# Patient Record
Sex: Female | Born: 1945 | Race: White | Hispanic: No | Marital: Married | Smoking: Former smoker
Health system: Southern US, Community
[De-identification: ages and names within clinical notes are randomized; demographics above are authoritative.]

## PROBLEM LIST (undated history)

## (undated) DIAGNOSIS — C50419 Malignant neoplasm of upper-outer quadrant of unspecified female breast: Secondary | ICD-10-CM

## (undated) DIAGNOSIS — IMO0001 Reserved for inherently not codable concepts without codable children: Secondary | ICD-10-CM

## (undated) DIAGNOSIS — M109 Gout, unspecified: Secondary | ICD-10-CM

## (undated) DIAGNOSIS — C50919 Malignant neoplasm of unspecified site of unspecified female breast: Secondary | ICD-10-CM

## (undated) DIAGNOSIS — C801 Malignant (primary) neoplasm, unspecified: Secondary | ICD-10-CM

## (undated) DIAGNOSIS — K769 Liver disease, unspecified: Secondary | ICD-10-CM

## (undated) DIAGNOSIS — Z853 Personal history of malignant neoplasm of breast: Secondary | ICD-10-CM

## (undated) DIAGNOSIS — Z923 Personal history of irradiation: Secondary | ICD-10-CM

## (undated) DIAGNOSIS — I10 Essential (primary) hypertension: Secondary | ICD-10-CM

## (undated) DIAGNOSIS — M1990 Unspecified osteoarthritis, unspecified site: Secondary | ICD-10-CM

## (undated) DIAGNOSIS — Z1239 Encounter for other screening for malignant neoplasm of breast: Secondary | ICD-10-CM

## (undated) DIAGNOSIS — Z1211 Encounter for screening for malignant neoplasm of colon: Secondary | ICD-10-CM

## (undated) DIAGNOSIS — N63 Unspecified lump in unspecified breast: Secondary | ICD-10-CM

## (undated) DIAGNOSIS — E119 Type 2 diabetes mellitus without complications: Secondary | ICD-10-CM

## (undated) DIAGNOSIS — E669 Obesity, unspecified: Secondary | ICD-10-CM

## (undated) DIAGNOSIS — N6310 Unspecified lump in the right breast, unspecified quadrant: Secondary | ICD-10-CM

## (undated) HISTORY — DX: Unspecified lump in unspecified breast: N63.0

## (undated) HISTORY — DX: Personal history of malignant neoplasm of breast: Z85.3

## (undated) HISTORY — DX: Malignant neoplasm of upper-outer quadrant of unspecified female breast: C50.419

## (undated) HISTORY — PX: COLONOSCOPY: SHX174

## (undated) HISTORY — DX: Essential (primary) hypertension: I10

## (undated) HISTORY — PX: TUBAL LIGATION: SHX77

## (undated) HISTORY — DX: Reserved for inherently not codable concepts without codable children: IMO0001

## (undated) HISTORY — DX: Unspecified osteoarthritis, unspecified site: M19.90

## (undated) HISTORY — DX: Obesity, unspecified: E66.9

## (undated) HISTORY — PX: BACK SURGERY: SHX140

## (undated) HISTORY — DX: Gout, unspecified: M10.9

## (undated) HISTORY — DX: Unspecified lump in the right breast, unspecified quadrant: N63.10

## (undated) HISTORY — DX: Encounter for other screening for malignant neoplasm of breast: Z12.39

## (undated) HISTORY — DX: Malignant (primary) neoplasm, unspecified: C80.1

## (undated) HISTORY — DX: Encounter for screening for malignant neoplasm of colon: Z12.11

## (undated) HISTORY — DX: Liver disease, unspecified: K76.9

## (undated) HISTORY — DX: Type 2 diabetes mellitus without complications: E11.9

---

## 2003-04-05 DIAGNOSIS — M109 Gout, unspecified: Secondary | ICD-10-CM

## 2003-04-05 HISTORY — DX: Gout, unspecified: M10.9

## 2004-11-09 ENCOUNTER — Ambulatory Visit: Payer: Self-pay | Admitting: Family Medicine

## 2004-12-16 ENCOUNTER — Ambulatory Visit: Payer: Self-pay | Admitting: Specialist

## 2005-08-09 ENCOUNTER — Ambulatory Visit: Payer: Self-pay | Admitting: Gastroenterology

## 2006-02-07 ENCOUNTER — Ambulatory Visit: Payer: Self-pay

## 2006-09-15 ENCOUNTER — Ambulatory Visit: Payer: Self-pay | Admitting: Family Medicine

## 2007-04-05 DIAGNOSIS — C50919 Malignant neoplasm of unspecified site of unspecified female breast: Secondary | ICD-10-CM

## 2007-04-05 DIAGNOSIS — Z853 Personal history of malignant neoplasm of breast: Secondary | ICD-10-CM

## 2007-04-05 DIAGNOSIS — C801 Malignant (primary) neoplasm, unspecified: Secondary | ICD-10-CM

## 2007-04-05 DIAGNOSIS — IMO0001 Reserved for inherently not codable concepts without codable children: Secondary | ICD-10-CM

## 2007-04-05 HISTORY — PX: BREAST LUMPECTOMY: SHX2

## 2007-04-05 HISTORY — DX: Personal history of malignant neoplasm of breast: Z85.3

## 2007-04-05 HISTORY — DX: Malignant neoplasm of unspecified site of unspecified female breast: C50.919

## 2007-04-05 HISTORY — DX: Reserved for inherently not codable concepts without codable children: IMO0001

## 2007-04-05 HISTORY — PX: BREAST BIOPSY: SHX20

## 2007-04-05 HISTORY — DX: Malignant (primary) neoplasm, unspecified: C80.1

## 2007-09-18 ENCOUNTER — Ambulatory Visit: Payer: Self-pay | Admitting: Internal Medicine

## 2007-10-29 ENCOUNTER — Ambulatory Visit: Payer: Self-pay | Admitting: Internal Medicine

## 2007-12-19 ENCOUNTER — Ambulatory Visit: Payer: Self-pay

## 2007-12-24 ENCOUNTER — Ambulatory Visit: Payer: Self-pay

## 2008-01-03 ENCOUNTER — Other Ambulatory Visit: Payer: Self-pay

## 2008-01-03 ENCOUNTER — Ambulatory Visit: Payer: Self-pay | Admitting: Radiation Oncology

## 2008-01-03 ENCOUNTER — Ambulatory Visit: Payer: Self-pay | Admitting: General Surgery

## 2008-01-08 ENCOUNTER — Ambulatory Visit: Payer: Self-pay | Admitting: General Surgery

## 2008-01-08 HISTORY — PX: BREAST SURGERY: SHX581

## 2008-01-29 ENCOUNTER — Ambulatory Visit: Payer: Self-pay | Admitting: Internal Medicine

## 2008-02-03 ENCOUNTER — Ambulatory Visit: Payer: Self-pay | Admitting: Internal Medicine

## 2008-02-19 ENCOUNTER — Ambulatory Visit: Payer: Self-pay | Admitting: Internal Medicine

## 2008-03-04 ENCOUNTER — Ambulatory Visit: Payer: Self-pay | Admitting: Internal Medicine

## 2008-04-04 ENCOUNTER — Ambulatory Visit: Payer: Self-pay | Admitting: Internal Medicine

## 2008-05-05 ENCOUNTER — Ambulatory Visit: Payer: Self-pay | Admitting: Internal Medicine

## 2008-06-02 ENCOUNTER — Ambulatory Visit: Payer: Self-pay | Admitting: Internal Medicine

## 2008-06-17 ENCOUNTER — Ambulatory Visit: Payer: Self-pay | Admitting: Internal Medicine

## 2008-06-20 ENCOUNTER — Ambulatory Visit: Payer: Self-pay | Admitting: Internal Medicine

## 2008-07-03 ENCOUNTER — Ambulatory Visit: Payer: Self-pay | Admitting: Internal Medicine

## 2008-07-23 ENCOUNTER — Ambulatory Visit: Payer: Self-pay

## 2008-10-02 ENCOUNTER — Ambulatory Visit: Payer: Self-pay | Admitting: Internal Medicine

## 2008-10-27 ENCOUNTER — Ambulatory Visit: Payer: Self-pay | Admitting: Internal Medicine

## 2008-11-02 ENCOUNTER — Ambulatory Visit: Payer: Self-pay | Admitting: Internal Medicine

## 2008-12-16 ENCOUNTER — Ambulatory Visit: Payer: Self-pay

## 2009-01-01 IMAGING — US ULTRASOUND RIGHT BREAST
1 series · 17 of 25 positions shown · non-contrast
Comparison: none

REASON FOR EXAM: Right pain, nipple discharge
COMMENTS:

[Series 1: ultrasound right breast · 17 of 50 slices shown]
[im 1/50]
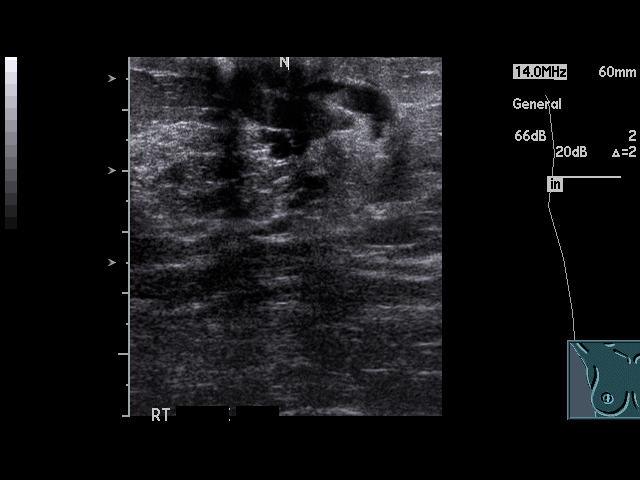
[im 5/50]
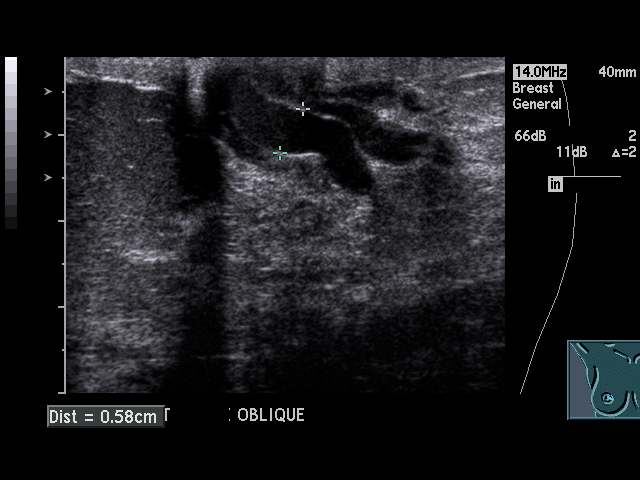
[im 7/50]
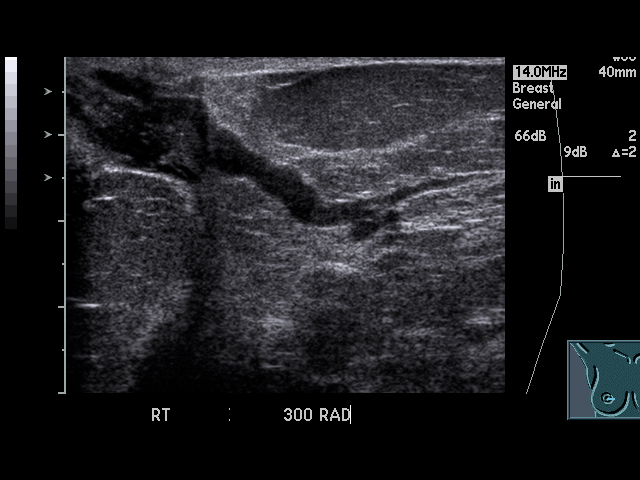
[im 11/50]
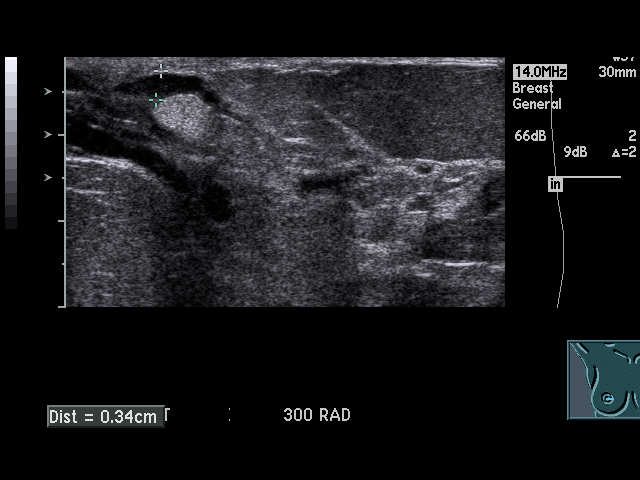
[im 13/50]
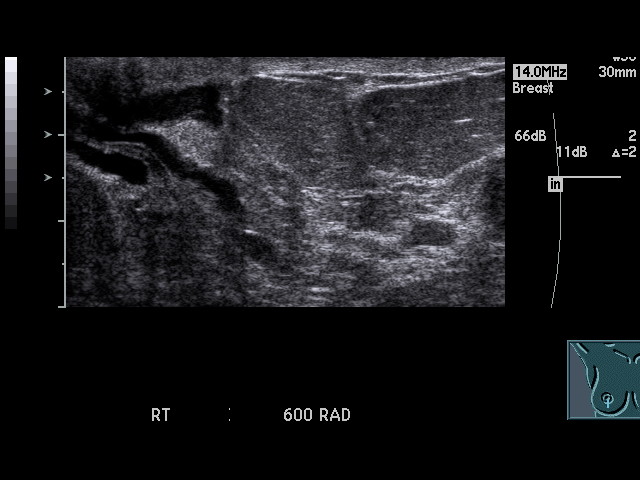
[im 17/50]
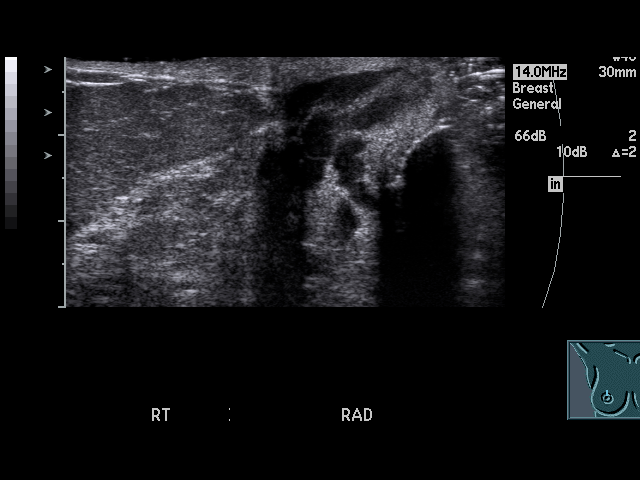
[im 19/50]
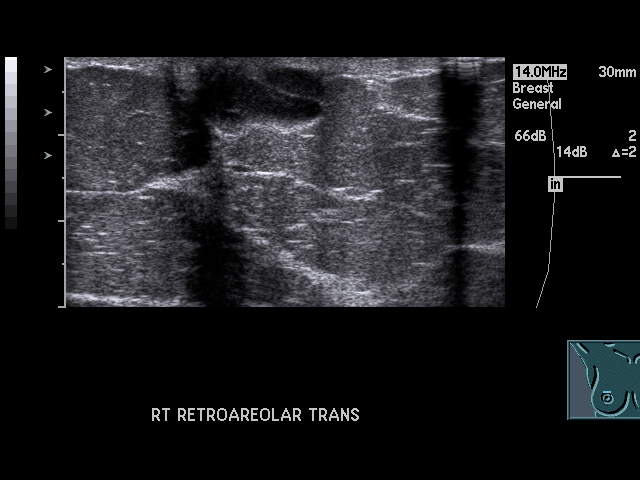
[im 23/50]
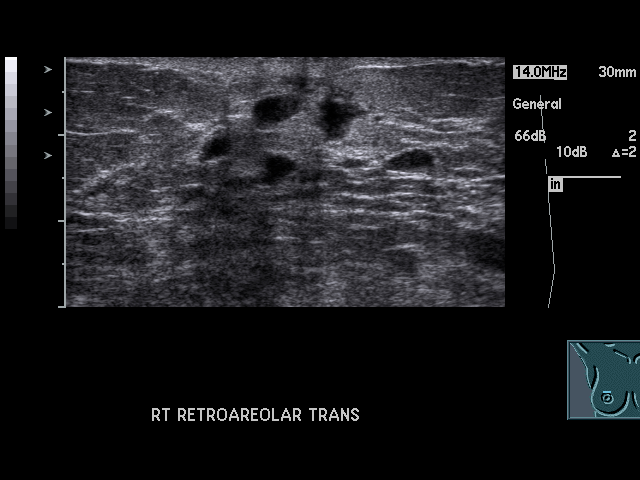
[im 25/50]
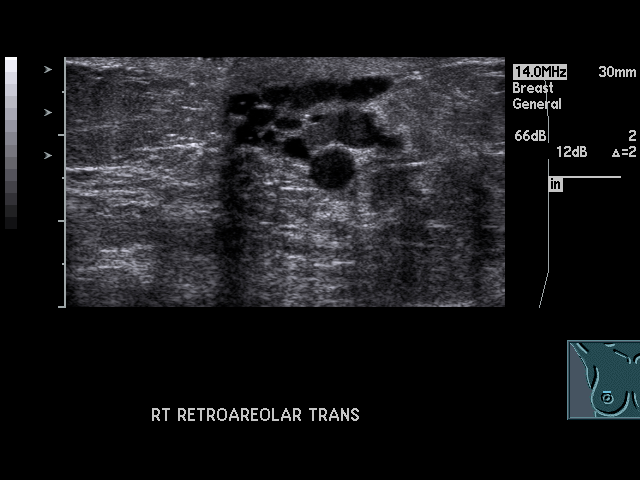
[im 27/50]
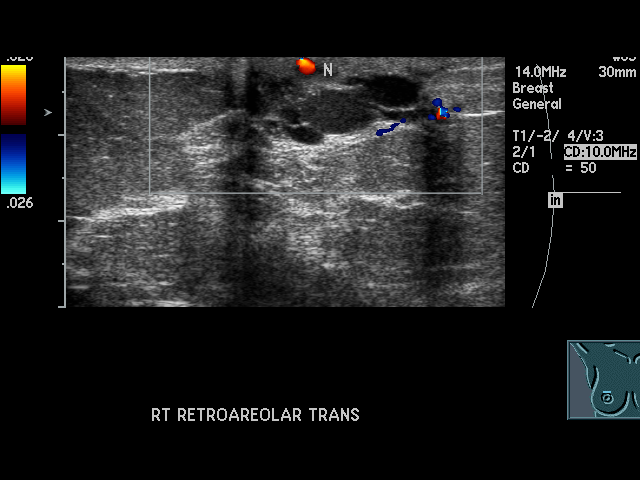
[im 31/50]
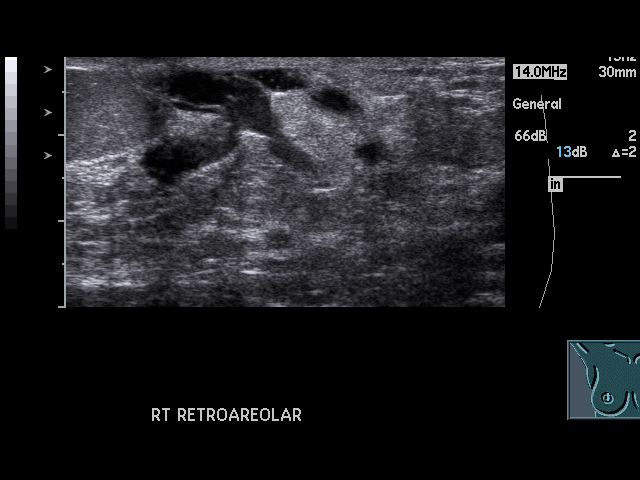
[im 33/50]
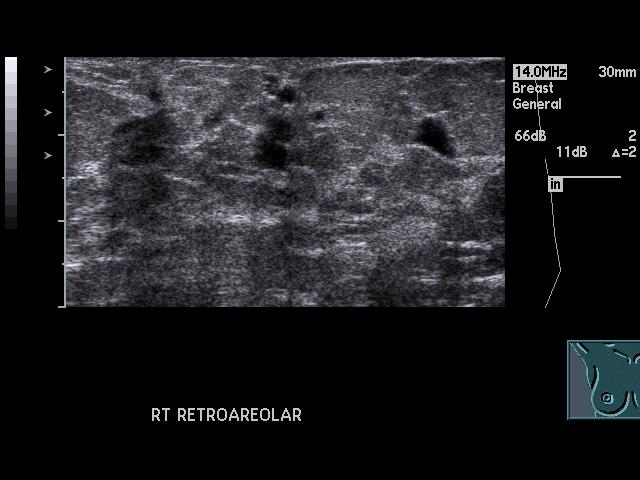
[im 37/50]
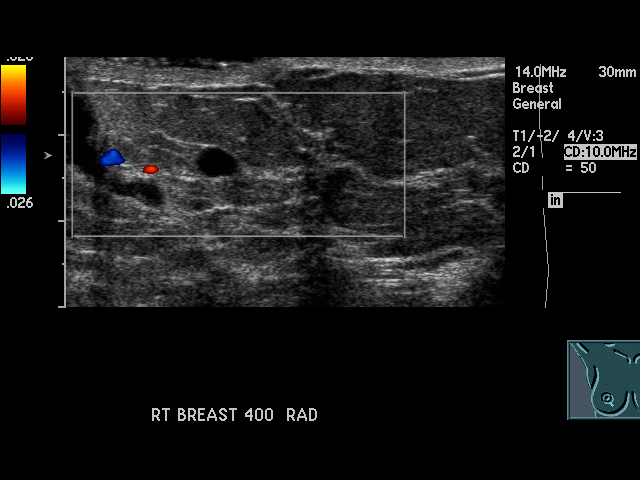
[im 39/50]
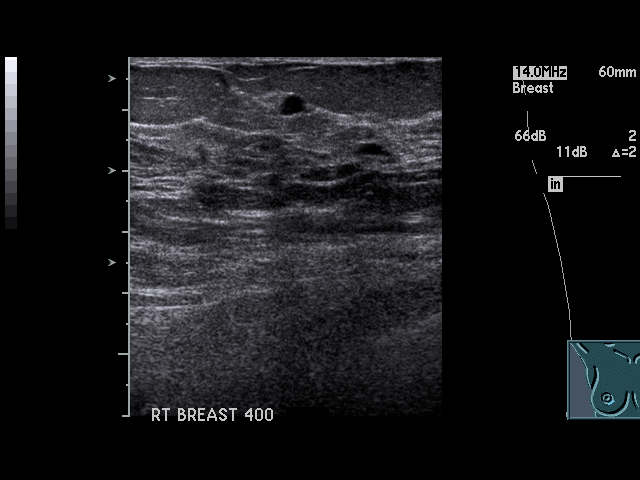
[im 43/50]
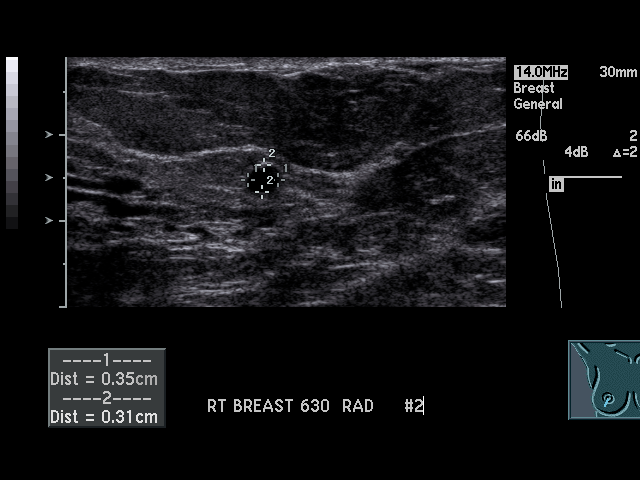
[im 45/50]
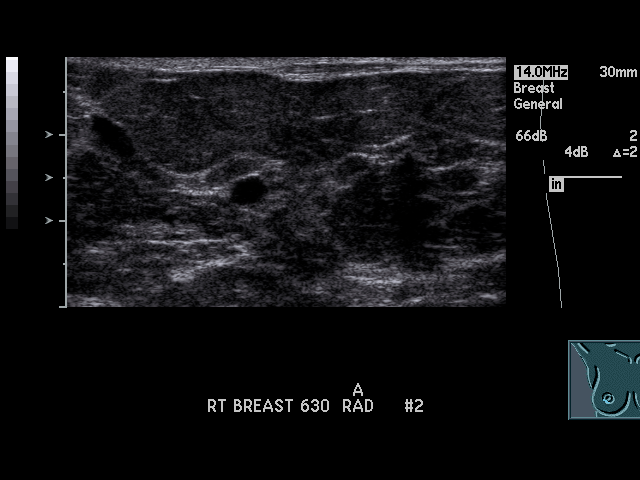
[im 50/50]
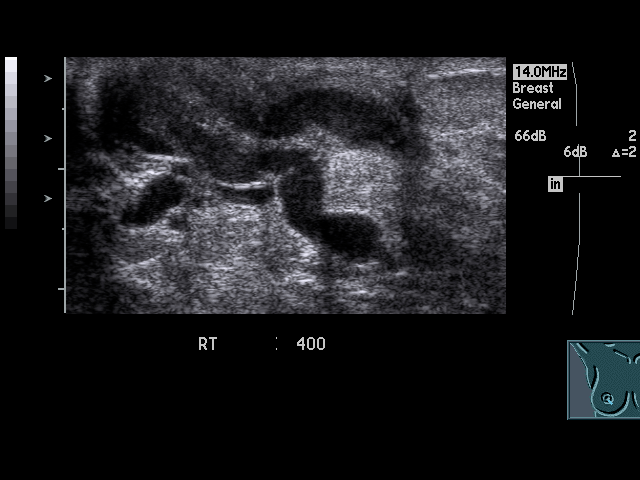

[17 of 25 positions shown; findings below may reference images not displayed]

PROCEDURE:     US  - US BREAST RIGHT  - September 15, 2006 [DATE]

RESULT:     Sonographic examination of the periareolar region on the RIGHT
shows moderate ductal dilatation compatible with ductal ectasia. No solid
mass lesions are seen. There are noted two cysts in the retroareolar area
with the larger measuring 4.7 mm at maximum diameter.
IMPRESSION: 1.     No malignancy is identified.
2.     There is noted ductal dilatation consistent with ductal ectasia.
3.     BI-RADS: Category 2 - Benign Finding.

Thank you for this opportunity to contribute to the care of your patient.

A NEGATIVE MAMMOGRAM REPORT DOES NOT PRECLUDE BIOPSY OR OTHER EVALUATION OF
A CLINICALLY PALPABLE OR OTHERWISE SUSPICIOUS MASS OR LESION. BREAST CANCER
MAY NOT BE DETECTED BY MAMMOGRAPHY IN UP TO 10% OF CASES.

## 2009-02-02 ENCOUNTER — Ambulatory Visit: Payer: Self-pay | Admitting: Internal Medicine

## 2009-02-18 DIAGNOSIS — E7800 Pure hypercholesterolemia, unspecified: Secondary | ICD-10-CM | POA: Insufficient documentation

## 2009-02-24 ENCOUNTER — Ambulatory Visit: Payer: Self-pay | Admitting: Internal Medicine

## 2009-03-04 ENCOUNTER — Ambulatory Visit: Payer: Self-pay | Admitting: Internal Medicine

## 2009-05-05 ENCOUNTER — Ambulatory Visit: Payer: Self-pay | Admitting: Internal Medicine

## 2009-05-20 ENCOUNTER — Ambulatory Visit: Payer: Self-pay | Admitting: Internal Medicine

## 2009-06-02 ENCOUNTER — Ambulatory Visit: Payer: Self-pay | Admitting: Internal Medicine

## 2009-06-19 ENCOUNTER — Ambulatory Visit: Payer: Self-pay

## 2009-06-23 ENCOUNTER — Ambulatory Visit: Payer: Self-pay | Admitting: Internal Medicine

## 2009-07-03 ENCOUNTER — Ambulatory Visit: Payer: Self-pay | Admitting: Internal Medicine

## 2009-12-03 ENCOUNTER — Ambulatory Visit: Payer: Self-pay | Admitting: Internal Medicine

## 2009-12-28 ENCOUNTER — Ambulatory Visit: Payer: Self-pay | Admitting: Internal Medicine

## 2010-01-04 ENCOUNTER — Ambulatory Visit: Payer: Self-pay | Admitting: Internal Medicine

## 2010-04-04 HISTORY — PX: BREAST BIOPSY: SHX20

## 2010-04-06 IMAGING — MG MM DIGITAL SCREENING BILAT W/ CAD
1 series · 4 of 4 positions shown · non-contrast
Comparison: none

REASON FOR EXAM: routine
COMMENTS:

[R CC · right · 4 of 4 slices shown]
[im 1/4]
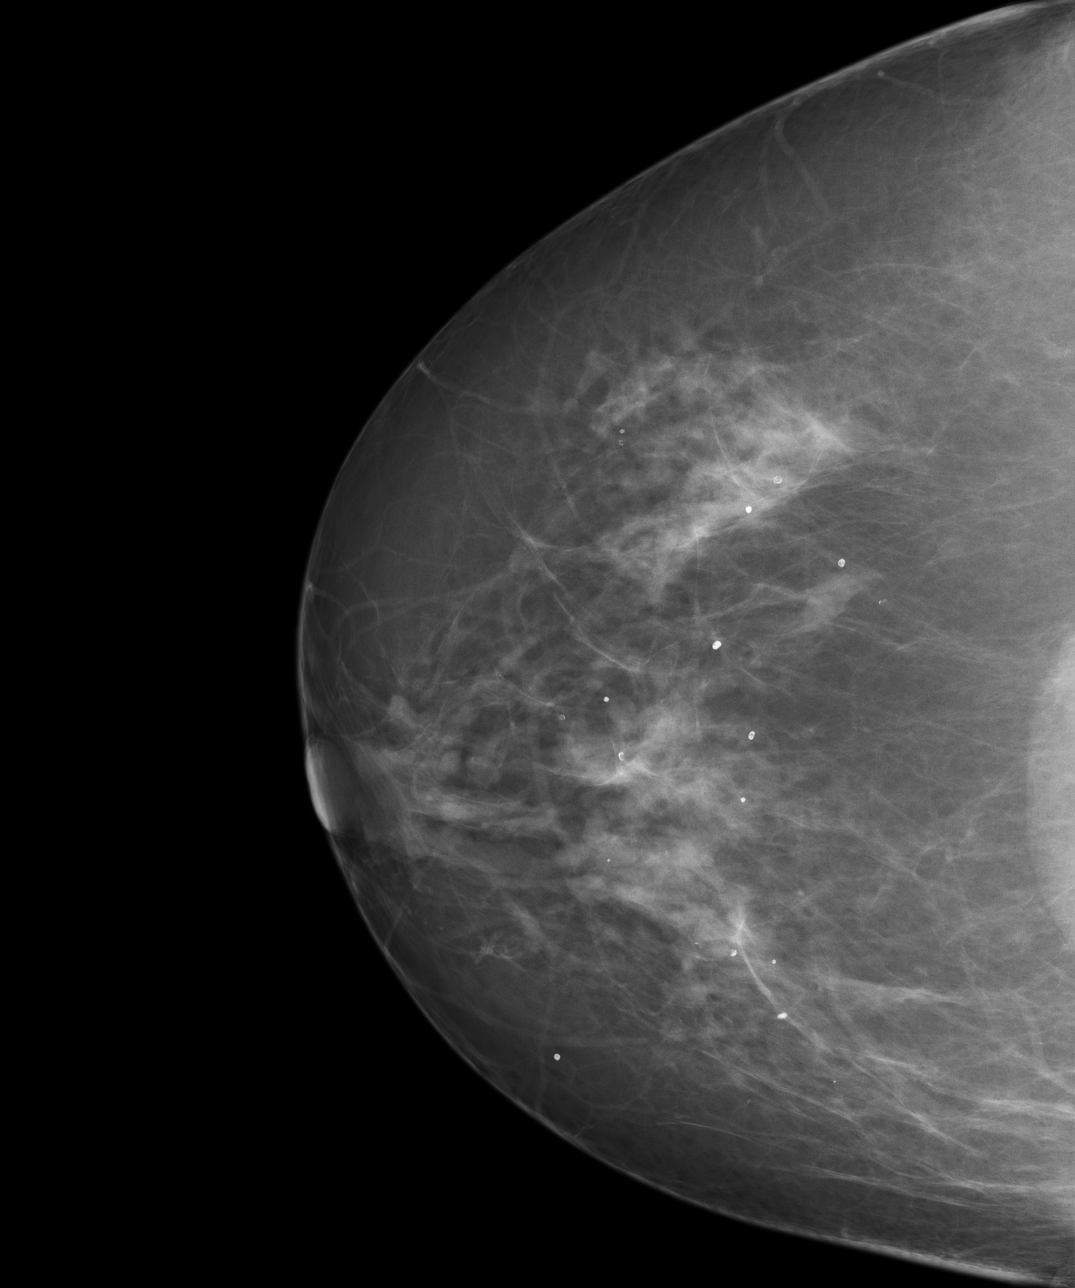
[im 2/4]
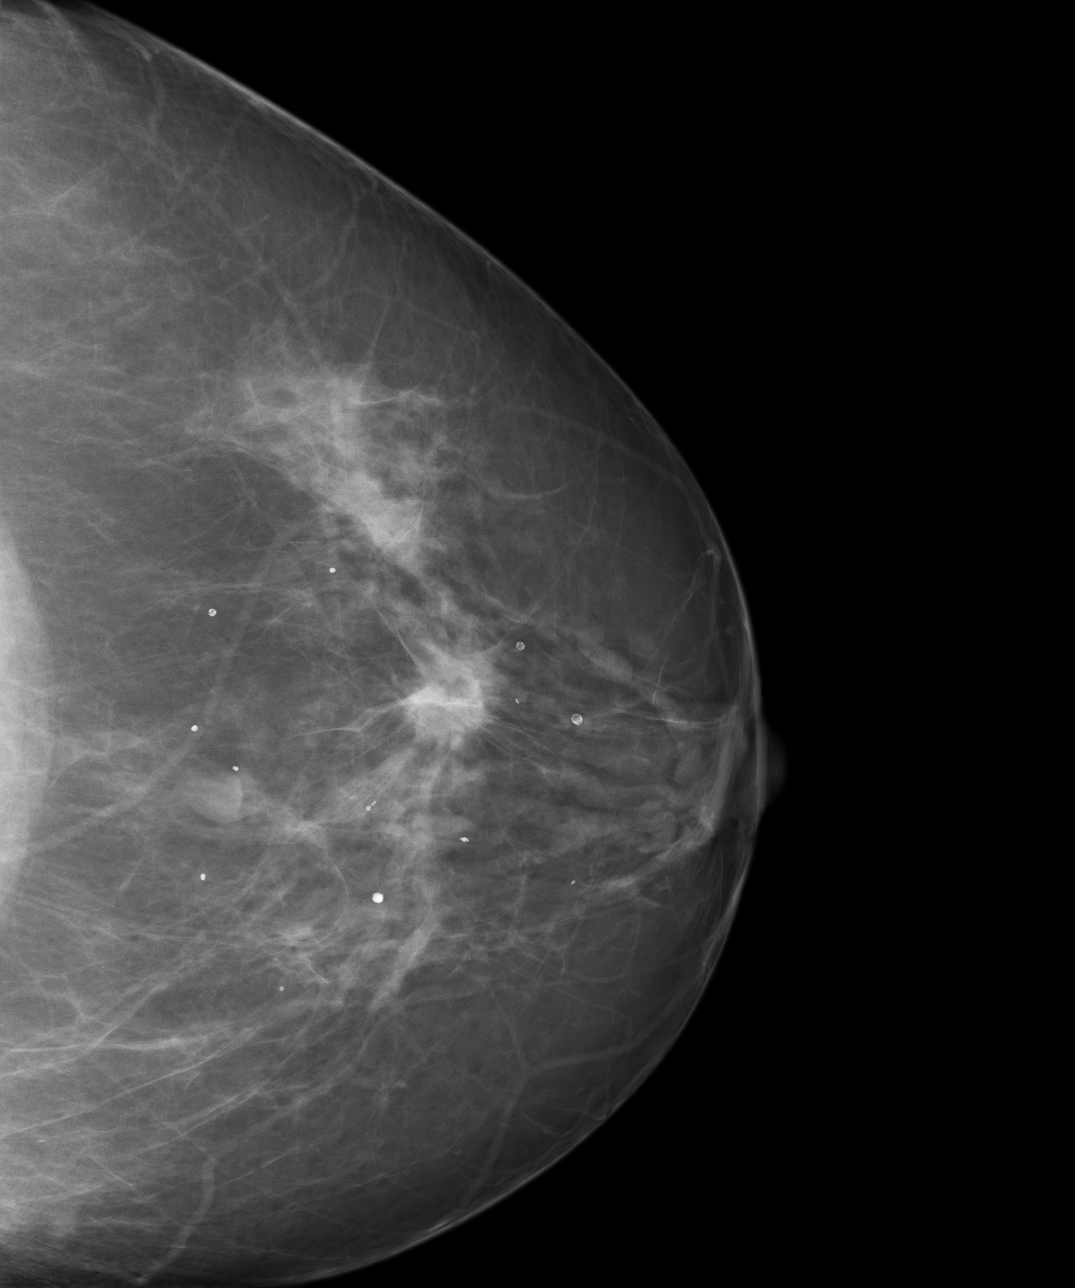
[im 3/4]
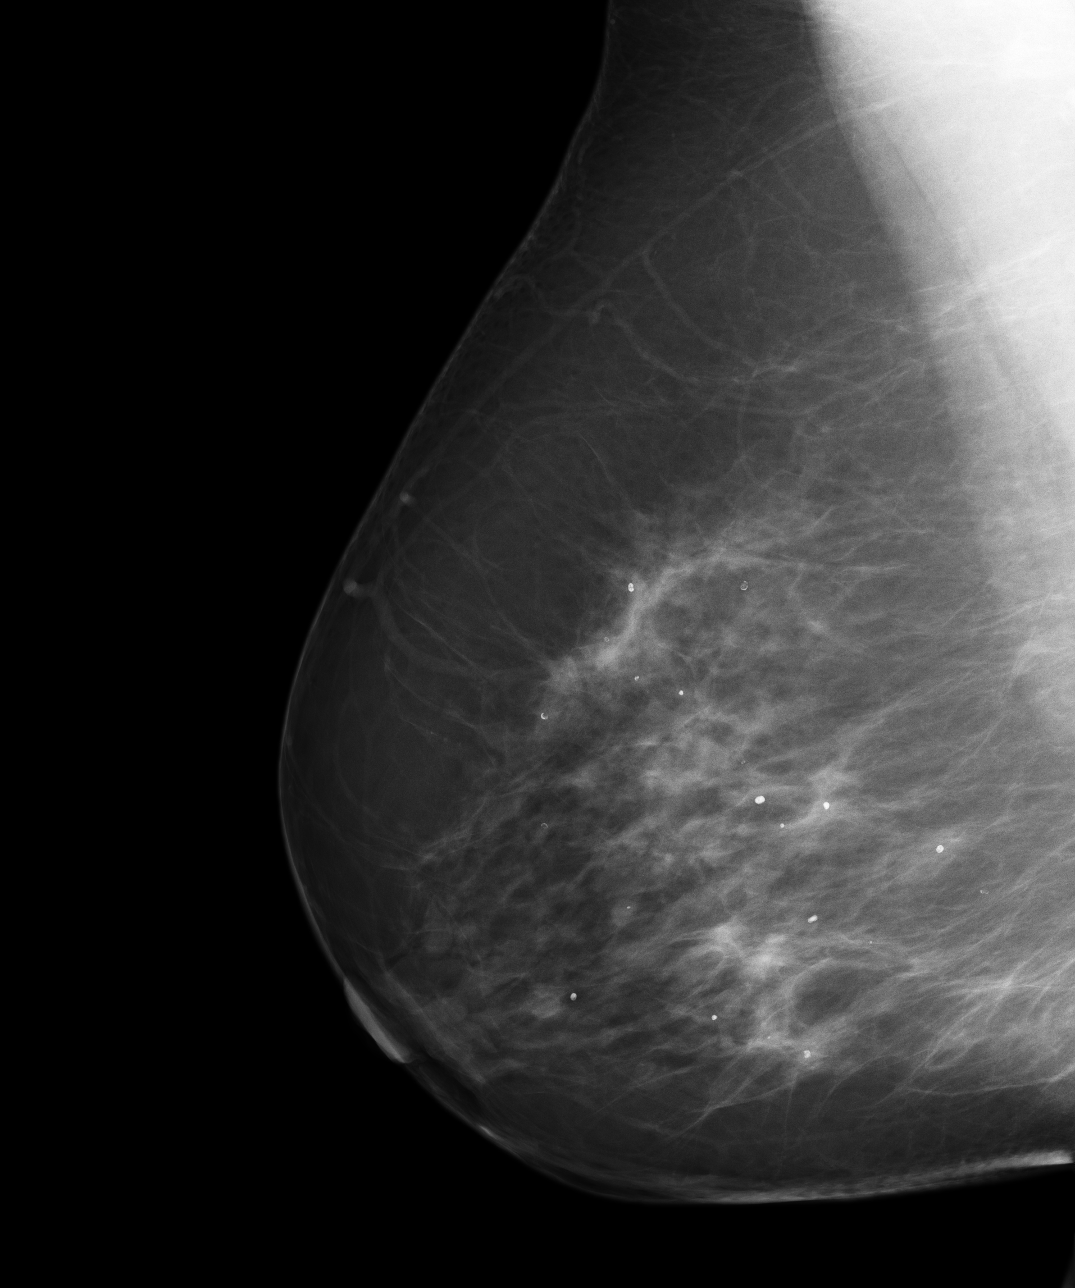
[im 4/4]
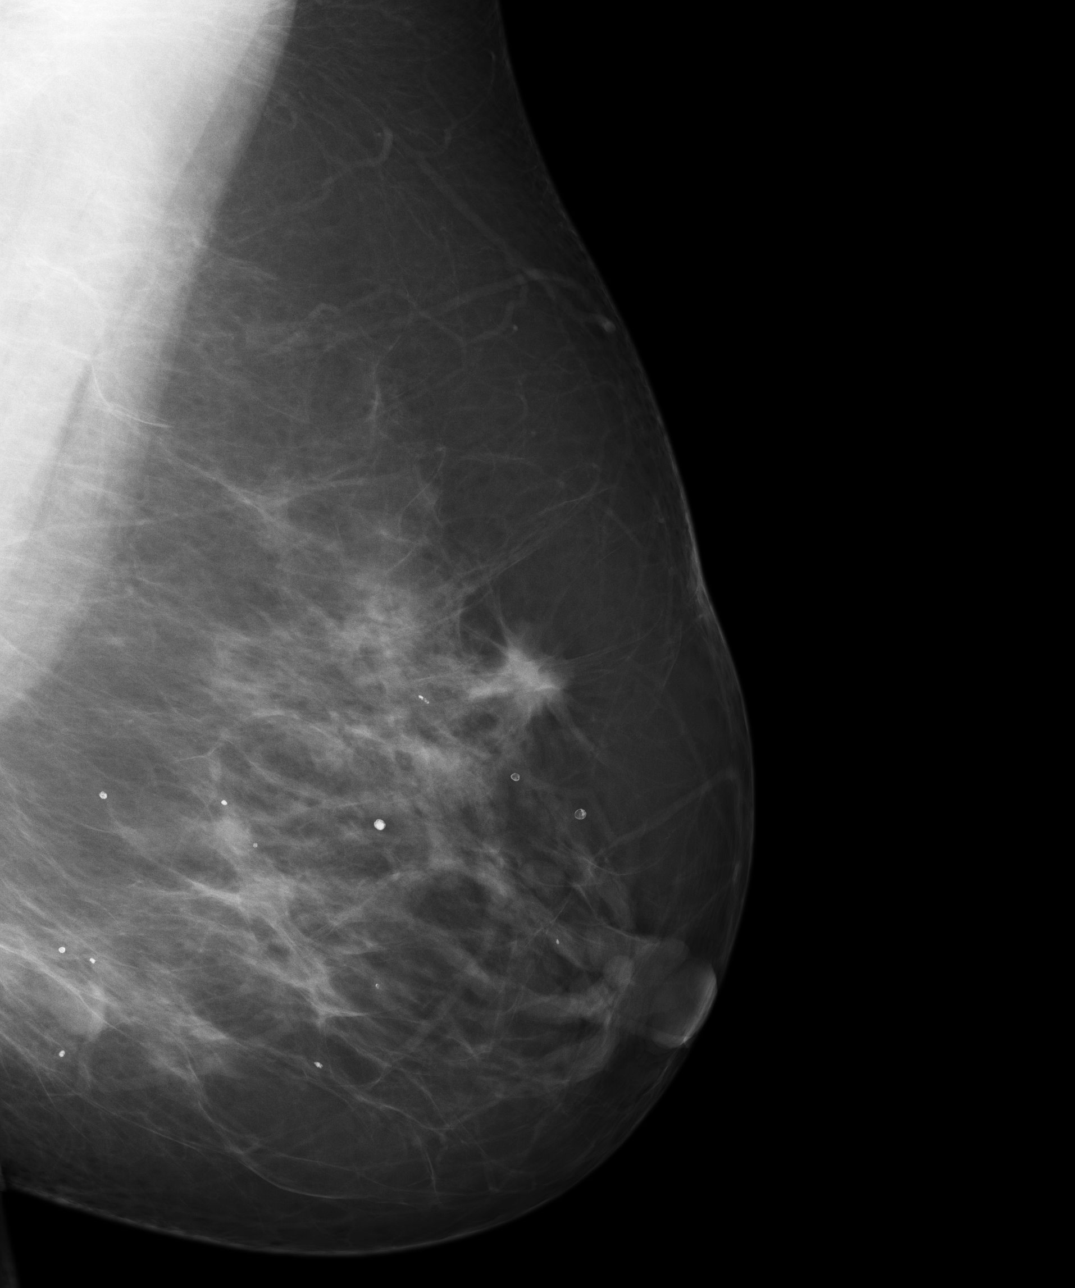

[4 of 4 positions shown; findings below may reference images not displayed]

PROCEDURE:     MAM - MAM [REDACTED] ABDULCEBAR DIG SCREEN W/CAD  - December 19, 2007  [DATE]

RESULT:  Noted is a parenchymal density in the upper portion of the LEFT
breast for which compression spot films are suggested for further
evaluation.  No other focal abnormalities are identified. Benign
calcifications are noted.  CAD evaluation is otherwise nonfocal.  This
parenchymal density may be central in the craniocaudal view.
IMPRESSION: 1.     Parenchymal density in the upper portion of the LEFT breast for which
compression spot films and if need be ultrasound suggested for further
evaluation.
2.     BI-RADS Category O-Needs Additional Imaging Evaluation.

A negative mammographic report does not preclude biopsy or other evaluation
of a clinically palpable or otherwise suspicious mass or lesion.  Breast
cancer may not be detected by mammography in up to 10% of cases.

## 2010-05-19 ENCOUNTER — Ambulatory Visit: Payer: Self-pay | Admitting: Internal Medicine

## 2010-06-03 ENCOUNTER — Ambulatory Visit: Payer: Self-pay | Admitting: Internal Medicine

## 2010-09-21 ENCOUNTER — Ambulatory Visit: Payer: Self-pay | Admitting: Urology

## 2010-12-08 ENCOUNTER — Ambulatory Visit: Payer: Self-pay | Admitting: Internal Medicine

## 2011-01-03 ENCOUNTER — Ambulatory Visit: Payer: Self-pay | Admitting: Internal Medicine

## 2011-01-18 ENCOUNTER — Ambulatory Visit: Payer: Self-pay

## 2011-06-07 ENCOUNTER — Ambulatory Visit: Payer: Self-pay | Admitting: Internal Medicine

## 2011-06-07 LAB — CBC CANCER CENTER
Basophil #: 0 x10 3/mm (ref 0.0–0.1)
Eosinophil #: 0.1 x10 3/mm (ref 0.0–0.7)
Eosinophil %: 1.8 %
Lymphocyte #: 2.6 x10 3/mm (ref 1.0–3.6)
Lymphocyte %: 32 %
MCH: 28.7 pg (ref 26.0–34.0)
MCV: 86 fL (ref 80–100)
Monocyte %: 4.7 %
Neutrophil %: 61.2 %
Platelet: 342 x10 3/mm (ref 150–440)
RBC: 4.45 10*6/uL (ref 3.80–5.20)
RDW: 15.1 % — ABNORMAL HIGH (ref 11.5–14.5)
WBC: 8.2 x10 3/mm (ref 3.6–11.0)

## 2011-06-07 LAB — HEPATIC FUNCTION PANEL A (ARMC)
Alkaline Phosphatase: 43 U/L — ABNORMAL LOW (ref 50–136)
Bilirubin, Direct: 0 mg/dL (ref 0.00–0.20)
Bilirubin,Total: 0.2 mg/dL (ref 0.2–1.0)

## 2011-06-07 LAB — CREATININE, SERUM
EGFR (African American): 52 — ABNORMAL LOW
EGFR (Non-African Amer.): 43 — ABNORMAL LOW

## 2011-07-04 ENCOUNTER — Ambulatory Visit: Payer: Self-pay | Admitting: Internal Medicine

## 2011-07-20 ENCOUNTER — Ambulatory Visit: Payer: Self-pay

## 2011-08-24 ENCOUNTER — Ambulatory Visit: Payer: Self-pay | Admitting: General Surgery

## 2012-01-27 ENCOUNTER — Ambulatory Visit: Payer: Self-pay | Admitting: General Surgery

## 2012-04-03 ENCOUNTER — Ambulatory Visit: Payer: Self-pay | Admitting: Internal Medicine

## 2012-04-03 LAB — CBC CANCER CENTER
Basophil #: 0.1 x10 3/mm (ref 0.0–0.1)
Eosinophil #: 0.2 x10 3/mm (ref 0.0–0.7)
HCT: 39.4 % (ref 35.0–47.0)
HGB: 12.7 g/dL (ref 12.0–16.0)
Lymphocyte %: 32.4 %
MCHC: 32.3 g/dL (ref 32.0–36.0)
Monocyte %: 3.9 %
Neutrophil %: 61.6 %
RDW: 15.5 % — ABNORMAL HIGH (ref 11.5–14.5)
WBC: 11.6 x10 3/mm — ABNORMAL HIGH (ref 3.6–11.0)

## 2012-04-03 LAB — HEPATIC FUNCTION PANEL A (ARMC)
Alkaline Phosphatase: 47 U/L — ABNORMAL LOW (ref 50–136)
Bilirubin,Total: 0.3 mg/dL (ref 0.2–1.0)
SGOT(AST): 29 U/L (ref 15–37)
SGPT (ALT): 29 U/L (ref 12–78)

## 2012-04-03 LAB — CREATININE, SERUM
Creatinine: 1.26 mg/dL (ref 0.60–1.30)
EGFR (African American): 51 — ABNORMAL LOW
EGFR (Non-African Amer.): 44 — ABNORMAL LOW

## 2012-04-04 ENCOUNTER — Ambulatory Visit: Payer: Self-pay | Admitting: Internal Medicine

## 2012-06-16 ENCOUNTER — Encounter: Payer: Self-pay | Admitting: General Surgery

## 2012-07-30 ENCOUNTER — Encounter: Payer: Self-pay | Admitting: *Deleted

## 2012-07-30 DIAGNOSIS — C801 Malignant (primary) neoplasm, unspecified: Secondary | ICD-10-CM | POA: Insufficient documentation

## 2012-08-20 ENCOUNTER — Ambulatory Visit: Payer: Medicare Other | Admitting: General Surgery

## 2012-09-03 ENCOUNTER — Encounter: Payer: Self-pay | Admitting: *Deleted

## 2012-09-11 ENCOUNTER — Ambulatory Visit (INDEPENDENT_AMBULATORY_CARE_PROVIDER_SITE_OTHER): Payer: Medicare Other | Admitting: General Surgery

## 2012-09-11 VITALS — BP 128/70 | HR 86 | Resp 14 | Ht 64.0 in | Wt 176.0 lb

## 2012-09-11 DIAGNOSIS — Z860100 Personal history of colon polyps, unspecified: Secondary | ICD-10-CM | POA: Insufficient documentation

## 2012-09-11 DIAGNOSIS — Z853 Personal history of malignant neoplasm of breast: Secondary | ICD-10-CM

## 2012-09-11 NOTE — Patient Instructions (Addendum)
The patient is aware to call back for any questions or concerns. Consider colonoscopy in 1 year Dr Sherrlyn Hock appointment is 10-01-12

## 2012-09-11 NOTE — Progress Notes (Signed)
Patient ID: Marisa Marsh, female   DOB: [birth year], 67 y.o.   MRN: [id number]  Chief Complaint  Patient presents with  . Other    colon polyps    HPI Marisa Marsh is a 67 y.o. female.  Patient here today for follow up colon polyps.  Last colonoscopy was 2013.  No new GI complaints. 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  HPI  Past Medical History  Diagnosis Date  . Cancer 2009    left breast wide excision with sn bx left axillary dissection. The nodes were clinically positive but negative on path review. She had 1.7 cm tumor that was ER positive and not oveerly expressing for HER2/neu. Her oncotype dx testing put her at low risk for metastatic disease.  [name] Hypertension   . Gout 2005  . Arthritis   . Personal history of malignant neoplasm of breast 2009    left breast  . Breast screening, unspecified   . Lump or mass in breast   . Special screening for malignant neoplasms, colon   . Obesity, unspecified   . Diabetes mellitus without complication   . Malignant neoplasm of upper-outer quadrant of female breast   . Disc 2009    bulging discs    Past Surgical History  Procedure Laterality Date  . Colonoscopy  2013    Dr. Lemar Livings  . Breast surgery Left 01/08/2008    left breast lumpectomy with sn biopsy  . Tubal ligation    . Back surgery      Family History  Problem Relation Age of Onset  . Cancer Other     family with colon and ovarian cancer, deceased and family member with breast cancer, alive    Social History History  Substance Use Topics  . Smoking status: Never Smoker   . Smokeless tobacco: Not on file  . Alcohol Use: No    No Known Allergies  Current Outpatient Prescriptions  Medication Sig Dispense Refill  . glimepiride (AMARYL) 2 MG tablet Take 2 mg by mouth daily before breakfast.      . Misc Natural Products (OSTEO BI-FLEX JOINT SHIELD PO) Take 2 tablets by mouth daily.       [name] allopurinol (ZYLOPRIM) 300 MG tablet Take 300 mg by mouth  daily.      [name] anastrozole (ARIMIDEX) 1 MG tablet Take 1 mg by mouth daily.      [name] aspirin 81 MG tablet Take 81 mg by mouth daily.      . Cholecalciferol (VITAMIN D3) 1000 UNITS CAPS Take 1 capsule by mouth daily.      [name] glimepiride (AMARYL) 4 MG tablet Take 4 mg by mouth 2 (two) times daily.      [name] HYDROcodone-acetaminophen (LORTAB) 7.5-500 MG per tablet Take 1 tablet by mouth daily.      . indomethacin (INDOCIN) 50 MG capsule Take 50 mg by mouth 3 (three) times daily with meals.      [name] lisinopril-hydrochlorothiazide (PRINZIDE,ZESTORETIC) 20-12.5 MG per tablet Take 1 tablet by mouth daily.      . metFORMIN (GLUCOPHAGE) 1000 MG tablet Take 1,000 mg by mouth 3 (three) times daily.      . nortriptyline (PAMELOR) 10 MG capsule Take 10 mg by mouth at bedtime.      . pravastatin (PRAVACHOL) 40 MG tablet Take 1 tablet by mouth 2 (two) times daily at 10 AM and 5 PM.       No current facility-administered medications for this visit.  Review of Systems Review of Systems  Constitutional: Negative.   Respiratory: Negative.   Cardiovascular: Negative.     Blood pressure 128/70, pulse 86, resp. rate 14, height 5\' 4"  (1.626 m), weight 176 lb (79.833 kg).  Physical Exam Physical Exam  Constitutional: She is oriented to person, place, and time. She appears well-developed and well-nourished.  Cardiovascular: Normal rate and regular rhythm.   Pulmonary/Chest: Effort normal and breath sounds normal.  Abdominal: Soft. Bowel sounds are normal.  Firm area 3 finger breath below xyphoid. I cannot confirm that this is involving the left lobe of the liver. It appears separate from the xiphoid process. No overlying skin fixation, erythema or other changes. Diastases recti as noted during the patient's movement from proceeded to supine position.  Neurological: She is alert and oriented to person, place, and time.  Skin: Skin is warm and dry.    Data Reviewed 2013 biopsy results  Assessment    Abnormal  colonoscopy, discordance between clinical exam and biopsy results.  4.5 years status post treatment of a left breast cancer.    Plan    The patient would like to defer followup colonoscopy for one year. Considering the biopsy findings this is reasonable, but I am concerned about the discordance between the clinical appearance at the time of the endoscopy and the biopsy results.  She will continue followup with her primary care physician he [facility] as well as with Dr. Sherrlyn Hock locally in regards to her breast cancer       Earline Mayotte 09/11/2012, 10:27 AM

## 2012-09-27 ENCOUNTER — Ambulatory Visit: Payer: Self-pay | Admitting: Internal Medicine

## 2012-10-01 LAB — CBC CANCER CENTER
Basophil #: 0 x10 3/mm (ref 0.0–0.1)
Basophil %: 0.5 %
Eosinophil #: 0.2 x10 3/mm (ref 0.0–0.7)
Eosinophil %: 2.4 %
HCT: 35.1 % (ref 35.0–47.0)
Lymphocyte #: 2.2 x10 3/mm (ref 1.0–3.6)
Lymphocyte %: 27.3 %
MCH: 29 pg (ref 26.0–34.0)
MCV: 87 fL (ref 80–100)
Monocyte %: 6 %
Neutrophil %: 63.8 %
Platelet: 254 x10 3/mm (ref 150–440)
RBC: 4.02 10*6/uL (ref 3.80–5.20)
WBC: 8.2 x10 3/mm (ref 3.6–11.0)

## 2012-10-01 LAB — HEPATIC FUNCTION PANEL A (ARMC)
Alkaline Phosphatase: 38 U/L — ABNORMAL LOW (ref 50–136)
Bilirubin, Direct: 0.1 mg/dL (ref 0.00–0.20)
SGPT (ALT): 22 U/L (ref 12–78)
Total Protein: 7.3 g/dL (ref 6.4–8.2)

## 2012-10-01 LAB — CREATININE, SERUM: EGFR (Non-African Amer.): 32 — ABNORMAL LOW

## 2012-10-02 ENCOUNTER — Ambulatory Visit: Payer: Self-pay | Admitting: Internal Medicine

## 2012-11-13 DIAGNOSIS — C50919 Malignant neoplasm of unspecified site of unspecified female breast: Secondary | ICD-10-CM | POA: Insufficient documentation

## 2012-11-23 DIAGNOSIS — M4802 Spinal stenosis, cervical region: Secondary | ICD-10-CM | POA: Insufficient documentation

## 2013-02-01 ENCOUNTER — Encounter: Payer: Self-pay | Admitting: General Surgery

## 2013-02-01 ENCOUNTER — Ambulatory Visit: Payer: Self-pay | Admitting: General Surgery

## 2013-02-06 ENCOUNTER — Ambulatory Visit: Payer: Medicare Other | Admitting: General Surgery

## 2013-02-12 ENCOUNTER — Ambulatory Visit (INDEPENDENT_AMBULATORY_CARE_PROVIDER_SITE_OTHER): Payer: Medicare Other | Admitting: General Surgery

## 2013-02-12 ENCOUNTER — Encounter: Payer: Self-pay | Admitting: General Surgery

## 2013-02-12 VITALS — BP 150/70 | HR 100 | Resp 16 | Ht 64.0 in | Wt 183.0 lb

## 2013-02-12 DIAGNOSIS — Z853 Personal history of malignant neoplasm of breast: Secondary | ICD-10-CM

## 2013-02-12 NOTE — Progress Notes (Signed)
Patient ID: Randal Buba Biernat, female   DOB: [birth year], 67 y.o.   MRN: [id number]  Chief Complaint  Patient presents with  . Follow-up    mammogram    HPI Suha G Boehne is a 67 y.o. female who presents for a breast evaluation. The most recent mammogram was done on 02/01/13.Patient does perform regular self breast checks and gets regular mammograms done. Patient states she does not feel any lumps and  is not having any pain in her breasts.  HPI  Past Medical History  Diagnosis Date  . Cancer 2009    left breast wide excision with sn bx left axillary dissection. The nodes were clinically positive but negative on path review. She had 1.7 cm tumor that was ER positive and not oveerly expressing for HER2/neu. Her oncotype dx testing put her at low risk for metastatic disease.  [name] Hypertension   . Gout 2005  . Arthritis   . Personal history of malignant neoplasm of breast 2009    left breast  . Breast screening, unspecified   . Lump or mass in breast   . Special screening for malignant neoplasms, colon   . Obesity, unspecified   . Diabetes mellitus without complication   . Malignant neoplasm of upper-outer quadrant of female breast   . Disc 2009    bulging discs    Past Surgical History  Procedure Laterality Date  . Colonoscopy  2013    Dr. Lemar Livings  . Breast surgery Left 01/08/2008    left breast lumpectomy with sn biopsy  . Tubal ligation    . Back surgery      Family History  Problem Relation Age of Onset  . Cancer Other     family with colon and ovarian cancer, deceased and family member with breast cancer, alive    Social History History  Substance Use Topics  . Smoking status: Never Smoker   . Smokeless tobacco: Never Used  . Alcohol Use: No    No Known Allergies  Current Outpatient Prescriptions  Medication Sig Dispense Refill  . allopurinol (ZYLOPRIM) 300 MG tablet Take 300 mg by mouth daily.      [name] amLODipine (NORVASC) 10 MG tablet Take 1 tablet by mouth  daily.      [name] anastrozole (ARIMIDEX) 1 MG tablet Take 1 mg by mouth daily.      [name] aspirin 81 MG tablet Take 81 mg by mouth daily.      . Cholecalciferol (VITAMIN D3) 1000 UNITS CAPS Take 1 capsule by mouth daily.      [name] glimepiride (AMARYL) 2 MG tablet Take 2 mg by mouth daily before breakfast.      . glimepiride (AMARYL) 4 MG tablet Take 4 mg by mouth 2 (two) times daily.      [name] HYDROcodone-acetaminophen (LORTAB) 7.5-500 MG per tablet Take 1 tablet by mouth daily.      . indomethacin (INDOCIN) 50 MG capsule Take 50 mg by mouth 3 (three) times daily with meals.      [name] lisinopril-hydrochlorothiazide (PRINZIDE,ZESTORETIC) 20-12.5 MG per tablet Take 1 tablet by mouth daily.      . metFORMIN (GLUCOPHAGE) 1000 MG tablet Take 1,000 mg by mouth 3 (three) times daily.      . Misc Natural Products (OSTEO BI-FLEX JOINT SHIELD PO) Take 2 tablets by mouth daily.       . nortriptyline (PAMELOR) 10 MG capsule Take 10 mg by mouth at bedtime.      . pravastatin (PRAVACHOL) 40  MG tablet Take 1 tablet by mouth 2 (two) times daily at 10 AM and 5 PM.       No current facility-administered medications for this visit.    Review of Systems Review of Systems  Constitutional: Negative.   Respiratory: Negative.   Cardiovascular: Negative.     Blood pressure 150/70, pulse 100, resp. rate 16, height 5\' 4"  (1.626 m), weight 183 lb (83.008 kg).  Physical Exam Physical Exam  Constitutional: She is oriented to person, place, and time. She appears well-developed and well-nourished.  Eyes: No scleral icterus.  Cardiovascular: Normal rate, regular rhythm and normal heart sounds.   Pulmonary/Chest: Breath sounds normal. Right breast exhibits no inverted nipple, no mass, no nipple discharge, no skin change and no tenderness. Left breast exhibits no inverted nipple, no mass, no nipple discharge, no skin change and no tenderness.  Left breast 12 o'clock slight volume lost. Well healed incision.   Abdominal: Soft. Normal  appearance and bowel sounds are normal. There is no hepatomegaly. There is no tenderness.  Lymphadenopathy:    She has no cervical adenopathy.    She has no axillary adenopathy.  Neurological: She is alert and oriented to person, place, and time.  Skin: Skin is warm and dry.  One the right cheek a 6 by 10 crusty skin spot.      Data Reviewed Bilateral mammogram dated February 01, 2013 was reviewed. Stable calcified nodules post treatment. BI-RAD-2.  Assessment    Doing well now 4 years after conservative treatment of a right breast cancer. She reports she is scheduled to complete her Arimidex therapy early next year under the care of Dr. Sherrlyn Hock.        Plan    We'll plan for a followup exam with bilateral mammograms in one year. Assuming a stable exam she may be a candidate for routine follow up with her PCP or with Dr. Sherrlyn Hock.         Earline Mayotte 02/12/2013, 9:27 PM

## 2013-02-12 NOTE — Patient Instructions (Addendum)
Patient to return in one year diagnotic bilateral mammogram.

## 2013-03-04 HISTORY — PX: CARPAL TUNNEL RELEASE: SHX101

## 2013-03-05 ENCOUNTER — Encounter: Payer: Self-pay | Admitting: General Surgery

## 2013-03-06 ENCOUNTER — Encounter: Payer: Self-pay | Admitting: General Surgery

## 2013-04-01 DIAGNOSIS — G5600 Carpal tunnel syndrome, unspecified upper limb: Secondary | ICD-10-CM | POA: Insufficient documentation

## 2013-06-18 DIAGNOSIS — G5793 Unspecified mononeuropathy of bilateral lower limbs: Secondary | ICD-10-CM | POA: Insufficient documentation

## 2013-08-07 DIAGNOSIS — I10 Essential (primary) hypertension: Secondary | ICD-10-CM | POA: Insufficient documentation

## 2013-08-07 DIAGNOSIS — E1122 Type 2 diabetes mellitus with diabetic chronic kidney disease: Secondary | ICD-10-CM | POA: Insufficient documentation

## 2013-08-13 DIAGNOSIS — Z0289 Encounter for other administrative examinations: Secondary | ICD-10-CM | POA: Insufficient documentation

## 2013-09-11 ENCOUNTER — Encounter: Payer: Self-pay | Admitting: General Surgery

## 2013-09-11 ENCOUNTER — Ambulatory Visit (INDEPENDENT_AMBULATORY_CARE_PROVIDER_SITE_OTHER): Payer: Medicare Other | Admitting: General Surgery

## 2013-09-11 VITALS — BP 140/82 | HR 76 | Resp 12 | Ht 63.0 in | Wt 183.0 lb

## 2013-09-11 DIAGNOSIS — Z86010 Personal history of colonic polyps: Secondary | ICD-10-CM

## 2013-09-11 MED ORDER — POLYETHYLENE GLYCOL 3350 17 GM/SCOOP PO POWD: 1.0000 | Freq: Once | ORAL | Status: DC

## 2013-09-11 NOTE — Progress Notes (Signed)
Patient ID: Marisa Marsh, female   DOB: April 20, 1945, 68 y.o.   MRN: [id number]  Chief Complaint  Patient presents with  . Follow-up    1 year follow up colon polyps. Pre-Op Colonoscopy    HPI Marisa Marsh is a 68 y.o. female who present for a 2 year follow up colon polyps. She was identified with a sessile polyp in the cecum in 2012, follow up in 2013 showed no change in pathology or proliferation. Location as precluded endoscopic resection.  Followed in [facility] for leg and back pain.   HPI  Past Medical History  Diagnosis Date  . Cancer 2009    left breast wide excision with sn bx left axillary dissection. The nodes were clinically positive but negative on path review. She had 1.7 cm tumor that was ER positive and not oveerly expressing for HER2/neu. Her oncotype dx testing put her at low risk for metastatic disease.  [name] Hypertension   . Gout 2005  . Arthritis   . Personal history of malignant neoplasm of breast 2009    left breast  . Breast screening, unspecified   . Lump or mass in breast   . Special screening for malignant neoplasms, colon   . Obesity, unspecified   . Diabetes mellitus without complication   . Malignant neoplasm of upper-outer quadrant of female breast   . Disc 2009    bulging discs    Past Surgical History  Procedure Laterality Date  . Colonoscopy  2013    Dr. Bary Castilla, 2.5 cm sessile polyp in the cecum, pathology showed a tubular adenoma without dysplasia.  . Breast surgery Left 01/08/2008    left breast lumpectomy with sn biopsy  . Tubal ligation    . Back surgery    . Carpal tunnel release Right Dec 2014    History reviewed. No pertinent family history.  Social History History  Substance Use Topics  . Smoking status: Former Research scientist (life sciences)  . Smokeless tobacco: Former Systems developer    Quit date: 04/05/1971  . Alcohol Use: No    Allergies  Allergen Reactions  . Simvastatin     Other reaction(s): MUSCLE PAIN    Current Outpatient  Prescriptions  Medication Sig Dispense Refill  . allopurinol (ZYLOPRIM) 300 MG tablet Take 300 mg by mouth daily.      [name] amLODipine (NORVASC) 5 MG tablet Take 5 mg by mouth.      [name] aspirin 81 MG tablet Take 81 mg by mouth daily.      . busPIRone (BUSPAR) 10 MG tablet Take 10 mg by mouth.      . Cholecalciferol (VITAMIN D3) 1000 UNITS CAPS Take 1 capsule by mouth daily.      . diclofenac (VOLTAREN) 50 MG EC tablet Take 50 mg by mouth.      [name] glimepiride (AMARYL) 4 MG tablet Take 4 mg by mouth 2 (two) times daily.      [name] HYDROcodone-acetaminophen (LORTAB) 7.5-500 MG per tablet Take 1 tablet by mouth daily.      . indomethacin (INDOCIN) 50 MG capsule Take 50 mg by mouth 3 (three) times daily as needed.       [name] lisinopril-hydrochlorothiazide (PRINZIDE,ZESTORETIC) 20-12.5 MG per tablet Take 1 tablet by mouth daily.      . metFORMIN (GLUCOPHAGE) 1000 MG tablet Take 1,000 mg by mouth 2 (two) times daily with a meal.       . nortriptyline (PAMELOR) 10 MG capsule Take 10 mg by mouth at  bedtime.      . pravastatin (PRAVACHOL) 40 MG tablet Take 1 tablet by mouth 2 (two) times daily at 10 AM and 5 PM.      . polyethylene glycol powder (GLYCOLAX/MIRALAX) powder Take 255 g (1 Container total) by mouth once.  255 g  0   No current facility-administered medications for this visit.    Review of Systems Review of Systems  Constitutional: Negative.   Cardiovascular: Negative.   Gastrointestinal: Negative for nausea, vomiting, diarrhea, constipation and blood in stool.    Blood pressure 140/82, pulse 76, resp. rate 12, height $RemoveBe'5\' 3"'talsrlWpL$  (1.6 m), weight 183 lb (83.008 kg).  Physical Exam Physical Exam  Constitutional: She is oriented to person, place, and time. She appears well-developed and well-nourished.  Neck: Neck supple.  Cardiovascular: Normal rate, regular rhythm and normal heart sounds.   Pulmonary/Chest: Effort normal and breath sounds normal.  Lymphadenopathy:    She has no cervical adenopathy.   Neurological: She is alert and oriented to person, place, and time.  Skin: Skin is warm and dry.    Data Reviewed Pathology from the 08/24/2011 colonoscopy showed a tubular adenoma without evidence of dysplasia. Review of the endoscopic images showed a 25 mm sessile polyp.  Assessment    Cecal polyp    Plan    Followup colonoscopy.     Colonoscopy with possible biopsy/polypectomy prn: Information regarding the procedure, including its potential risks and complications (including but not limited to perforation of the bowel, which may require emergency surgery to repair, and bleeding) was verbally given to the patient. Educational information regarding lower instestinal endoscopy was given to the patient. Written instructions for how to complete the bowel prep using Miralax were provided. The importance of drinking ample fluids to avoid dehydration as a result of the prep emphasized.  Patient is scheduled for a Colonoscopy at [facility] on 10/02/13. Patient will hold her Metformin the day of prep and the day of Colonoscopy. She will only take her Amlodipine and Lisinopril-HCTZ at 6 am with a small sip of water the morning of. She is aware to pre register with the hospital at least 2 days prior. Miralax prescription has been sent into her pharmacy. Patient is aware of date and instructions.  PCP: Clyde Lundborg 09/13/2013, 12:43 PM

## 2013-09-11 NOTE — Patient Instructions (Addendum)
Colonoscopy A colonoscopy is an exam to look at the entire large intestine (colon). This exam can help find problems such as tumors, polyps, inflammation, and areas of bleeding. The exam takes about 1 hour.  LET [facility] CARE PROVIDER KNOW ABOUT:   Any allergies you have.  All medicines you are taking, including vitamins, herbs, eye drops, creams, and over-the-counter medicines.  Previous problems you or members of your family have had with the use of anesthetics.  Any blood disorders you have.  Previous surgeries you have had.  Medical conditions you have. RISKS AND COMPLICATIONS  Generally, this is a safe procedure. However, as with any procedure, complications can occur. Possible complications include:  Bleeding.  Tearing or rupture of the colon wall.  Reaction to medicines given during the exam.  Infection (rare). BEFORE THE PROCEDURE   Ask your health care provider about changing or stopping your regular medicines.  You may be prescribed an oral bowel prep. This involves drinking a large amount of medicated liquid, starting the day before your procedure. The liquid will cause you to have multiple loose stools until your stool is almost clear or light green. This cleans out your colon in preparation for the procedure.  Do not eat or drink anything else once you have started the bowel prep, unless your health care provider tells you it is safe to do so.  Arrange for someone to drive you home after the procedure. PROCEDURE   You will be given medicine to help you relax (sedative).  You will lie on your side with your knees bent.  A long, flexible tube with a light and camera on the end (colonoscope) will be inserted through the rectum and into the colon. The camera sends video back to a computer screen as it moves through the colon. The colonoscope also releases carbon dioxide gas to inflate the colon. This helps your health care provider see the area better.  During  the exam, your health care provider may take a small tissue sample (biopsy) to be examined under a microscope if any abnormalities are found.  The exam is finished when the entire colon has been viewed. AFTER THE PROCEDURE   Do not drive for 24 hours after the exam.  You may have a small amount of blood in your stool.  You may pass moderate amounts of gas and have mild abdominal cramping or bloating. This is caused by the gas used to inflate your colon during the exam.  Ask when your test results will be ready and how you will get your results. Make sure you get your test results. Document Released: 03/18/2000 Document Revised: 01/09/2013 Document Reviewed: 11/26/2012 [facility] Patient Information 2014 [place].  Patient is scheduled for a Colonoscopy at [facility] on 10/02/13. Patient will hold her Metformin the day of prep and the day of Colonoscopy. She will only take her Amlodipine and Lisinopril-HCTZ at 6 am with a small sip of water the morning of. She is aware to pre register with the hospital at least 2 days prior. Miralax prescription has been sent into her pharmacy. Patient is aware of date and instructions.  PCP: Jacklynn Barnacle

## 2013-09-13 ENCOUNTER — Encounter: Payer: Self-pay | Admitting: General Surgery

## 2013-09-13 ENCOUNTER — Other Ambulatory Visit: Payer: Self-pay | Admitting: General Surgery

## 2013-09-13 DIAGNOSIS — Z86010 Personal history of colonic polyps: Secondary | ICD-10-CM

## 2013-10-02 ENCOUNTER — Ambulatory Visit: Payer: Self-pay | Admitting: General Surgery

## 2013-10-02 ENCOUNTER — Ambulatory Visit: Payer: Self-pay | Admitting: Internal Medicine

## 2013-10-02 DIAGNOSIS — D128 Benign neoplasm of rectum: Secondary | ICD-10-CM

## 2013-10-02 DIAGNOSIS — D129 Benign neoplasm of anus and anal canal: Secondary | ICD-10-CM

## 2013-10-03 LAB — PATHOLOGY REPORT

## 2013-10-09 ENCOUNTER — Telehealth: Payer: Self-pay | Admitting: *Deleted

## 2013-10-09 NOTE — Telephone Encounter (Signed)
Pt called looking for her results from a Colonoscopy done on 10/02/13

## 2013-10-10 ENCOUNTER — Encounter: Payer: Self-pay | Admitting: General Surgery

## 2013-10-11 ENCOUNTER — Telehealth: Payer: Self-pay | Admitting: General Surgery

## 2013-10-11 NOTE — Telephone Encounter (Signed)
Notified path showed only a tubular adenoma. No atypia. Unchanged from 2012 and 2013 exams. Tolerated procedure well. Will complete a f/u colonoscopy in three years.

## 2013-10-15 ENCOUNTER — Encounter: Payer: Self-pay | Admitting: General Surgery

## 2014-01-22 ENCOUNTER — Ambulatory Visit: Payer: Self-pay | Admitting: Internal Medicine

## 2014-01-22 LAB — CBC CANCER CENTER
BASOS ABS: 0.1 x10 3/mm (ref 0.0–0.1)
Basophil %: 0.6 %
Eosinophil #: 0.3 x10 3/mm (ref 0.0–0.7)
Eosinophil %: 2.2 %
HCT: 40 % (ref 35.0–47.0)
HGB: 12.9 g/dL (ref 12.0–16.0)
LYMPHS ABS: 3.8 x10 3/mm — AB (ref 1.0–3.6)
LYMPHS PCT: 33.8 %
MCH: 28.6 pg (ref 26.0–34.0)
MCHC: 32.2 g/dL (ref 32.0–36.0)
MCV: 89 fL (ref 80–100)
MONO ABS: 0.7 x10 3/mm (ref 0.2–0.9)
MONOS PCT: 5.9 %
Neutrophil #: 6.5 x10 3/mm (ref 1.4–6.5)
Neutrophil %: 57.5 %
Platelet: 268 x10 3/mm (ref 150–440)
RBC: 4.5 10*6/uL (ref 3.80–5.20)
RDW: 14.9 % — AB (ref 11.5–14.5)
WBC: 11.3 x10 3/mm — AB (ref 3.6–11.0)

## 2014-01-22 LAB — HEPATIC FUNCTION PANEL A (ARMC)
ALT: 38 U/L
Albumin: 3.5 g/dL (ref 3.4–5.0)
Alkaline Phosphatase: 38 U/L — ABNORMAL LOW
BILIRUBIN DIRECT: 0.1 mg/dL (ref 0.0–0.2)
Bilirubin,Total: 0.4 mg/dL (ref 0.2–1.0)
SGOT(AST): 39 U/L — ABNORMAL HIGH (ref 15–37)
Total Protein: 7.9 g/dL (ref 6.4–8.2)

## 2014-01-22 LAB — CREATININE, SERUM
Creatinine: 1.3 mg/dL (ref 0.60–1.30)
EGFR (African American): 53 — ABNORMAL LOW
EGFR (Non-African Amer.): 43 — ABNORMAL LOW

## 2014-02-02 ENCOUNTER — Ambulatory Visit: Payer: Self-pay | Admitting: Internal Medicine

## 2014-02-03 ENCOUNTER — Encounter: Payer: Self-pay | Admitting: General Surgery

## 2014-02-05 ENCOUNTER — Encounter: Payer: Self-pay | Admitting: General Surgery

## 2014-02-05 ENCOUNTER — Ambulatory Visit: Payer: Self-pay | Admitting: General Surgery

## 2014-02-19 ENCOUNTER — Encounter: Payer: Self-pay | Admitting: General Surgery

## 2014-02-19 ENCOUNTER — Ambulatory Visit (INDEPENDENT_AMBULATORY_CARE_PROVIDER_SITE_OTHER): Payer: Medicare Other | Admitting: General Surgery

## 2014-02-19 VITALS — BP 132/74 | HR 120 | Resp 12 | Ht 64.0 in | Wt 179.0 lb

## 2014-02-19 DIAGNOSIS — Z853 Personal history of malignant neoplasm of breast: Secondary | ICD-10-CM

## 2014-02-19 NOTE — Progress Notes (Signed)
Patient ID: Marisa Marsh, female   DOB: 1945/08/26, 68 y.o.   MRN: [id number]  Chief Complaint  Patient presents with  . Follow-up    HPI Marisa Marsh is a 68 y.o. female.  who presents for her follow up breast cancer and breast evaluation. The most recent mammogram was done on 02-05-14.  Patient does perform regular self breast checks and gets regular mammograms done.  No new breast issues. She has been released from the [facility] and has inflated her planned five-year course of Arimidex.  The patient reports that she was told that she had enlargement of the liver. This was 3-4 years ago when she reported fullness in the epigastrium. She reports an MRI scan being completed at [facility], with no plans for intervention.  She recently has been reassigned to a new primary care physician. HPI  Past Medical History  Diagnosis Date  . Cancer 2009    left breast wide excision with sn bx left axillary dissection. The nodes were clinically positive but negative on path review. She had 1.7 cm tumor that was ER positive and not oveerly expressing for HER2/neu. Her oncotype dx testing put her at low risk for metastatic disease.  [name] Hypertension   . Gout 2005  . Arthritis   . Personal history of malignant neoplasm of breast 2009    left breast  . Breast screening, unspecified   . Lump or mass in breast   . Special screening for malignant neoplasms, colon   . Obesity, unspecified   . Diabetes mellitus without complication   . Malignant neoplasm of upper-outer quadrant of female breast   . Disc 2009    bulging discs  . Liver disease     Past Surgical History  Procedure Laterality Date  . Colonoscopy  2013, 2015    Dr. Bary Castilla, 2.5 cm sessile polyp in the cecum, pathology showed a tubular adenoma without dysplasia.  . Breast surgery Left 01/08/2008    left breast lumpectomy with sn biopsy  . Tubal ligation    . Back surgery    . Carpal tunnel release Right Dec 2014    No family history  on file.  Social History History  Substance Use Topics  . Smoking status: Former Research scientist (life sciences)  . Smokeless tobacco: Former Systems developer    Quit date: 04/05/1971  . Alcohol Use: No    Allergies  Allergen Reactions  . Simvastatin     Other reaction(s): MUSCLE PAIN    Current Outpatient Prescriptions  Medication Sig Dispense Refill  . allopurinol (ZYLOPRIM) 300 MG tablet Take 300 mg by mouth daily.    [name] aspirin 81 MG tablet Take 81 mg by mouth daily.    . Cholecalciferol (VITAMIN D3) 1000 UNITS CAPS Take 1 capsule by mouth daily.    [name] glimepiride (AMARYL) 4 MG tablet Take 4 mg by mouth 2 (two) times daily.    . Glucosamine-Chondroitin (OSTEO BI-FLEX REGULAR STRENGTH PO) Take by mouth daily.    [name] HYDROcodone-acetaminophen (LORTAB) 7.5-500 MG per tablet Take 1 tablet by mouth daily.    [name] lisinopril-hydrochlorothiazide (PRINZIDE,ZESTORETIC) 20-12.5 MG per tablet Take 1 tablet by mouth daily.    . metFORMIN (GLUCOPHAGE) 1000 MG tablet Take 1,000 mg by mouth 2 (two) times daily with a meal.     . nortriptyline (PAMELOR) 10 MG capsule Take 10 mg by mouth at bedtime.    . pravastatin (PRAVACHOL) 40 MG tablet Take 1 tablet by mouth 2 (two) times daily at 10 AM  and 5 PM.    . busPIRone (BUSPAR) 10 MG tablet Take 10 mg by mouth.     No current facility-administered medications for this visit.    Review of Systems Review of Systems  Constitutional: Negative.   Respiratory: Negative.   Cardiovascular: Negative.     Blood pressure 132/74, pulse 120, resp. rate 12, height $RemoveBe'5\' 4"'UFVSAvKbn$  (1.626 m), weight 179 lb (81.194 kg).  Physical Exam Physical Exam  Constitutional: She is oriented to person, place, and time. She appears well-developed and well-nourished.  Neck: Neck supple.  Cardiovascular: Normal rate, regular rhythm and normal heart sounds.   Pulmonary/Chest: Effort normal and breath sounds normal. Right breast exhibits no inverted nipple, no mass, no nipple discharge, no skin change and no tenderness.  Left breast exhibits no inverted nipple, no mass, no nipple discharge, no skin change and no tenderness.  Abdominal: Normal appearance.  Fullness in epigastrium, mildly tender to palpation  Lymphadenopathy:    She has no cervical adenopathy.    She has no axillary adenopathy.  Neurological: She is alert and oriented to person, place, and time.  Skin: Skin is warm and dry.    Data Reviewed Bilateral mammograms dated 02/05/2014 showed no interval change. BI-RADS-2.  Assessment    Stable breast exam.  Tubular adenoma of the cecum.  Hepatic enlargement by patient report.    Plan    We'll plan for follow-up examination in one year.     Follow up in one year with bilateral diagnostic mammogram and office visit.  PCP:  Clyde Lundborg 02/19/2014, 9:06 PM

## 2014-02-19 NOTE — Patient Instructions (Addendum)
Continue self breast exams. Call office for any new breast issues or concerns. Follow up in one year with bilateral diagnostic mammogram and office visit  

## 2014-12-25 ENCOUNTER — Other Ambulatory Visit: Payer: Self-pay

## 2014-12-25 DIAGNOSIS — Z853 Personal history of malignant neoplasm of breast: Secondary | ICD-10-CM

## 2015-02-09 ENCOUNTER — Other Ambulatory Visit: Payer: Self-pay

## 2015-02-09 ENCOUNTER — Inpatient Hospital Stay: Admission: RE | Admit: 2015-02-09 | Payer: Self-pay | Source: Ambulatory Visit

## 2015-02-09 ENCOUNTER — Ambulatory Visit: Payer: Self-pay | Attending: General Surgery

## 2015-02-16 ENCOUNTER — Ambulatory Visit: Payer: Self-pay | Admitting: General Surgery

## 2015-03-04 ENCOUNTER — Other Ambulatory Visit: Payer: Self-pay | Admitting: General Surgery

## 2015-03-04 ENCOUNTER — Ambulatory Visit
Admission: RE | Admit: 2015-03-04 | Discharge: 2015-03-04 | Disposition: A | Discharge status: Home / Self Care | Payer: Medicare Other | Source: Ambulatory Visit | Attending: General Surgery | Admitting: General Surgery

## 2015-03-04 DIAGNOSIS — Z853 Personal history of malignant neoplasm of breast: Secondary | ICD-10-CM

## 2015-03-04 HISTORY — DX: Malignant neoplasm of unspecified site of unspecified female breast: C50.919

## 2015-03-18 ENCOUNTER — Ambulatory Visit: Payer: Self-pay | Admitting: General Surgery

## 2015-04-15 ENCOUNTER — Ambulatory Visit: Payer: Self-pay | Admitting: General Surgery

## 2015-04-23 ENCOUNTER — Ambulatory Visit: Payer: Self-pay | Admitting: General Surgery

## 2015-04-29 ENCOUNTER — Encounter: Payer: Self-pay | Admitting: General Surgery

## 2015-04-29 ENCOUNTER — Ambulatory Visit (INDEPENDENT_AMBULATORY_CARE_PROVIDER_SITE_OTHER): Payer: Medicare Other | Admitting: General Surgery

## 2015-04-29 VITALS — BP 160/72 | HR 80 | Resp 12 | Ht 64.0 in | Wt 176.0 lb

## 2015-04-29 DIAGNOSIS — Z853 Personal history of malignant neoplasm of breast: Secondary | ICD-10-CM

## 2015-04-29 NOTE — Patient Instructions (Signed)
Patient to return in 10 months bilateral screening mammogram

## 2015-04-29 NOTE — Progress Notes (Signed)
Patient ID: Marisa Marsh, female   DOB: [birth year], 70 y.o.   MRN: [id number]  Chief Complaint  Patient presents with  . Follow-up    mammograms    HPI Marisa Marsh is a 70 y.o. female who presents for a breast evaluation. The most recent mammogram was done on 02/06/15.  Patient does perform regular self breast checks and gets regular mammograms done.    I personally reviewed the patient's history.  HPI  Past Medical History  Diagnosis Date  . Hypertension   . Gout 2005  . Arthritis   . Personal history of malignant neoplasm of breast 2009    left breast  . Breast screening, unspecified   . Lump or mass in breast   . Special screening for malignant neoplasms, colon   . Obesity, unspecified   . Diabetes mellitus without complication (HCC)   . Disc 2009    bulging discs  . Liver disease   . Cancer [facility]) 2009    left breast wide excision with sn bx left axillary dissection. The nodes were clinically positive but negative on path review. She had 1.7 cm tumor that was ER positive and not oveerly expressing for HER2/neu. Her oncotype dx testing put her at low risk for metastatic disease.  . Malignant neoplasm of upper-outer quadrant of female breast (HCC)   . Breast cancer [facility]) 2009    Left- radiation    Past Surgical History  Procedure Laterality Date  . Colonoscopy  2013, 2015    Dr. Lemar Livings, 2.5 cm sessile polyp in the cecum, pathology showed a tubular adenoma without dysplasia.  . Breast surgery Left 01/08/2008    left breast lumpectomy with sn biopsy  . Tubal ligation    . Back surgery    . Carpal tunnel release Right Dec 2014  . Breast biopsy Left 2009    positive  . Breast biopsy Left 2012    neg    Family History  Problem Relation Age of Onset  . Breast cancer Neg Hx     Social History Social History  Substance Use Topics  . Smoking status: Former Games developer  . Smokeless tobacco: Former Neurosurgeon    Quit date: 04/05/1971  . Alcohol Use: No    Allergies   Allergen Reactions  . Simvastatin     Other reaction(s): MUSCLE PAIN    Current Outpatient Prescriptions  Medication Sig Dispense Refill  . allopurinol (ZYLOPRIM) 300 MG tablet Take 300 mg by mouth daily.    [name] aspirin 81 MG tablet Take 81 mg by mouth daily.    . Cholecalciferol (VITAMIN D3) 1000 UNITS CAPS Take 1 capsule by mouth daily.    [name] glimepiride (AMARYL) 4 MG tablet Take 4 mg by mouth 2 (two) times daily.    . Glucosamine-Chondroitin (OSTEO BI-FLEX REGULAR STRENGTH PO) Take by mouth daily.    [name] HYDROcodone-acetaminophen (LORTAB) 7.5-500 MG per tablet Take 1 tablet by mouth daily.    [name] lisinopril-hydrochlorothiazide (PRINZIDE,ZESTORETIC) 20-12.5 MG per tablet Take 1 tablet by mouth daily.    . metFORMIN (GLUCOPHAGE) 1000 MG tablet Take 1,000 mg by mouth 2 (two) times daily with a meal.     . nortriptyline (PAMELOR) 10 MG capsule Take 10 mg by mouth at bedtime.    . pravastatin (PRAVACHOL) 40 MG tablet Take 1 tablet by mouth 2 (two) times daily at 10 AM and 5 PM.    . Thiamine HCl (VITAMIN B-1) 250 MG tablet Take 250 mg by mouth daily.  No current facility-administered medications for this visit.    Review of Systems Review of Systems  Constitutional: Negative.   Respiratory: Negative.   Cardiovascular: Negative.     Blood pressure 160/72, pulse 80, resp. rate 12, height '5\' 4"'$  (1.626 m), weight 176 lb (79.833 kg).  Physical Exam Physical Exam  Constitutional: She is oriented to person, place, and time. She appears well-developed and well-nourished.  Eyes: Conjunctivae are normal. No scleral icterus.  Neck: Neck supple.  Cardiovascular: Normal rate and normal heart sounds.   Pulmonary/Chest: Effort normal and breath sounds normal. Right breast exhibits no inverted nipple, no mass, no nipple discharge, no skin change and no tenderness. Left breast exhibits no inverted nipple, no mass, no nipple discharge, no skin change and no tenderness.  Lymphadenopathy:    She has  no cervical adenopathy.    She has no axillary adenopathy.  Neurological: She is alert and oriented to person, place, and time.  Skin: Skin is warm and dry.    Data Reviewed 03/04/2015 bilateral mammograms were reviewed. No interval change. BI-RADS-2.  10/02/2013 cecal polyp (maximum diameter: 1.8 cm) showed a tubular adenoma. Plans for follow-up in July 2018.  Assessment    Doing well since the age years status post treatment of her left breast cancer. No evidence of recurrent disease.  Tubular adenoma the cecum    Plan       Patient to return in 11 months bilateral screening mammogram PCP:  [place] This information has been scribed by Gaspar Cola CMA.    Robert Bellow 04/30/2015, 4:57 PM

## 2016-01-19 ENCOUNTER — Other Ambulatory Visit: Payer: Self-pay

## 2016-01-19 DIAGNOSIS — Z1231 Encounter for screening mammogram for malignant neoplasm of breast: Secondary | ICD-10-CM

## 2016-03-09 ENCOUNTER — Other Ambulatory Visit: Payer: Self-pay | Admitting: General Surgery

## 2016-03-09 ENCOUNTER — Ambulatory Visit
Admission: RE | Admit: 2016-03-09 | Discharge: 2016-03-09 | Disposition: A | Discharge status: Home / Self Care | Payer: Medicare Other | Source: Ambulatory Visit | Attending: General Surgery | Admitting: General Surgery

## 2016-03-09 DIAGNOSIS — Z1231 Encounter for screening mammogram for malignant neoplasm of breast: Secondary | ICD-10-CM | POA: Diagnosis not present

## 2016-03-10 ENCOUNTER — Encounter: Payer: Self-pay | Admitting: *Deleted

## 2016-03-15 ENCOUNTER — Ambulatory Visit: Payer: Medicare Other | Admitting: General Surgery

## 2016-04-07 ENCOUNTER — Ambulatory Visit: Payer: Medicare Other | Admitting: General Surgery

## 2016-04-19 ENCOUNTER — Ambulatory Visit (INDEPENDENT_AMBULATORY_CARE_PROVIDER_SITE_OTHER): Payer: Medicare Other | Admitting: General Surgery

## 2016-04-19 ENCOUNTER — Encounter: Payer: Self-pay | Admitting: General Surgery

## 2016-04-19 VITALS — BP 122/58 | HR 126 | Resp 16 | Ht 64.0 in | Wt 173.0 lb

## 2016-04-19 DIAGNOSIS — Z853 Personal history of malignant neoplasm of breast: Secondary | ICD-10-CM

## 2016-04-19 NOTE — Progress Notes (Signed)
Patient ID: Marisa Marsh, female   DOB: 1946/02/20, 71 y.o.   MRN: [id number]  Chief Complaint  Patient presents with  . Follow-up    mammogram    HPI Marisa Marsh is a 71 y.o. female who presents for follow up breast cancer and a breast evaluation. The most recent mammogram was done on 03/09/2016.  Patient does perform regular self breast checks and gets regular mammograms done.  No new breast issues. She states she has some head cold symptoms. She is using Sudafed. Denies any chest pressure   HPI  Past Medical History:  Diagnosis Date  . Arthritis   . Breast cancer ([place]) 2009   Left- radiation  . Breast screening, unspecified   . Cancer [facility]) 2009   left breast wide excision with sn bx left axillary dissection. The nodes were clinically positive but negative on path review. She had 1.7 cm tumor that was ER positive and not oveerly expressing for HER2/neu. Her oncotype dx testing put her at low risk for metastatic disease.  . Diabetes mellitus without complication ([place])   . Disc 2009   bulging discs  . Gout 2005  . Hypertension   . Liver disease   . Lump or mass in breast   . Malignant neoplasm of upper-outer quadrant of female breast ([place])   . Obesity, unspecified   . Personal history of malignant neoplasm of breast 2009   left breast  . Special screening for malignant neoplasms, colon     Past Surgical History:  Procedure Laterality Date  . BACK SURGERY    . BREAST BIOPSY Left 2009   positive  . BREAST BIOPSY Left 2012   neg  . BREAST SURGERY Left 01/08/2008   left breast lumpectomy with sn biopsy  . CARPAL TUNNEL RELEASE Right Dec 2014  . COLONOSCOPY  2013, 2015   Dr. Bary Castilla, 2.5 cm sessile polyp in the cecum, pathology showed a tubular adenoma without dysplasia.  . TUBAL LIGATION      Family History  Problem Relation Age of Onset  . Breast cancer Neg Hx     Social History Social History  Substance Use Topics  . Smoking status: Former Research scientist (life sciences)  .  Smokeless tobacco: Former Systems developer    Quit date: 04/05/1971  . Alcohol use No    Allergies  Allergen Reactions  . Simvastatin     Other reaction(s): MUSCLE PAIN    Current Outpatient Prescriptions  Medication Sig Dispense Refill  . allopurinol (ZYLOPRIM) 300 MG tablet Take 300 mg by mouth daily.    [name] aspirin 81 MG tablet Take 81 mg by mouth daily.    . Cholecalciferol (VITAMIN D3) 1000 UNITS CAPS Take 1 capsule by mouth daily.    [name] glimepiride (AMARYL) 4 MG tablet Take 4 mg by mouth 2 (two) times daily.    . Glucosamine-Chondroitin (OSTEO BI-FLEX REGULAR STRENGTH PO) Take by mouth daily.    [name] HYDROcodone-acetaminophen (LORTAB) 7.5-500 MG per tablet Take 1 tablet by mouth 2 (two) times daily.     [name] lisinopril-hydrochlorothiazide (PRINZIDE,ZESTORETIC) 20-12.5 MG per tablet Take 1 tablet by mouth daily.    . metFORMIN (GLUCOPHAGE) 1000 MG tablet Take 1,000 mg by mouth 2 (two) times daily with a meal.     . nortriptyline (PAMELOR) 10 MG capsule Take 10 mg by mouth at bedtime.    . pravastatin (PRAVACHOL) 40 MG tablet Take 1 tablet by mouth 2 (two) times daily at 10 AM and 5 PM.    .  Thiamine HCl (VITAMIN B-1) 250 MG tablet Take 250 mg by mouth daily.     No current facility-administered medications for this visit.     Review of Systems Review of Systems  Constitutional: Negative.   Respiratory: Positive for cough.   Cardiovascular: Negative.     Blood pressure (!) 122/58, pulse (!) 126, resp. rate 16, height '5\' 4"'$  (1.626 m), weight 173 lb (78.5 kg).  Physical Exam Physical Exam  Constitutional: She is oriented to person, place, and time. She appears well-developed and well-nourished.  HENT:  Mouth/Throat: Oropharynx is clear and moist.  Eyes: Conjunctivae are normal. No scleral icterus.  Neck: Neck supple.  Cardiovascular: Normal rate, regular rhythm and normal heart sounds.   Pulmonary/Chest: Effort normal and breath sounds normal. Right breast exhibits no inverted nipple, no  mass, no nipple discharge, no skin change and no tenderness. Left breast exhibits no inverted nipple, no mass, no nipple discharge, no skin change and no tenderness.    Left breast lumpectomy site clean.  Abdominal: There is no tenderness.  Lymphadenopathy:    She has no cervical adenopathy.    She has no axillary adenopathy.  Neurological: She is alert and oriented to person, place, and time.  Skin: Skin is warm and dry.  Psychiatric: Her behavior is normal.    Data Reviewed Bilateral screening mammograms dated 03/09/2016 reviewed. Progressive calcification in the site of previous radiation "boost". BI-RADS-1.  2.5 cm tubular adenoma removed from the ascending colon in July 2015. Follow-up colonoscopy planned for 3 years.  Assessment    No evidence of recurrent breast cancer.    Plan    Repeat colonoscopy this summer.     The patient has been asked to return to the office in one year with a bilateral screening mammogram.  This information has been scribed by Karie Fetch RN, BSN,BC. [name]  Robert Bellow 04/19/2016, 11:31 AM

## 2016-04-19 NOTE — Patient Instructions (Addendum)
The patient has been asked to return to the office in one year with a bilateral screeningmammogram.The patient is aware to call back for any questions or concerns.  

## 2017-01-24 ENCOUNTER — Other Ambulatory Visit: Payer: Self-pay

## 2017-01-24 DIAGNOSIS — Z1231 Encounter for screening mammogram for malignant neoplasm of breast: Secondary | ICD-10-CM

## 2017-03-14 ENCOUNTER — Ambulatory Visit
Admission: RE | Admit: 2017-03-14 | Discharge: 2017-03-14 | Disposition: A | Discharge status: Home / Self Care | Payer: Medicare Other | Source: Ambulatory Visit | Attending: General Surgery | Admitting: General Surgery

## 2017-03-14 DIAGNOSIS — Z1231 Encounter for screening mammogram for malignant neoplasm of breast: Secondary | ICD-10-CM

## 2017-03-14 HISTORY — DX: Personal history of irradiation: Z92.3

## 2017-03-21 ENCOUNTER — Ambulatory Visit: Payer: Medicare Other | Admitting: General Surgery

## 2017-04-06 ENCOUNTER — Ambulatory Visit: Payer: Medicare Other | Admitting: General Surgery

## 2017-04-06 ENCOUNTER — Encounter: Payer: Self-pay | Admitting: General Surgery

## 2017-04-06 VITALS — BP 140/76 | HR 125 | Resp 12 | Ht 64.0 in | Wt 170.0 lb

## 2017-04-06 DIAGNOSIS — Z853 Personal history of malignant neoplasm of breast: Secondary | ICD-10-CM | POA: Diagnosis not present

## 2017-04-06 DIAGNOSIS — Z86010 Personal history of colonic polyps: Secondary | ICD-10-CM

## 2017-04-06 MED ORDER — POLYETHYLENE GLYCOL 3350 17 GM/SCOOP PO POWD: ORAL | 0 refills | Status: DC

## 2017-04-06 NOTE — Patient Instructions (Addendum)
The patient has been asked to return to the office in one year with a bilateral screening mammogram. The patient is aware to call back for any questions or concerns. Colonoscopy, Adult A colonoscopy is an exam to look at the entire large intestine. During the exam, a lubricated, bendable tube is inserted into the anus and then passed into the rectum, colon, and other parts of the large intestine. A colonoscopy is often done as a part of normal colorectal screening or in response to certain symptoms, such as anemia, persistent diarrhea, abdominal pain, and blood in the stool. The exam can help screen for and diagnose medical problems, including:  Tumors.  Polyps.  Inflammation.  Areas of bleeding.  Tell a health care provider about:  Any allergies you have.  All medicines you are taking, including vitamins, herbs, eye drops, creams, and over-the-counter medicines.  Any problems you or family members have had with anesthetic medicines.  Any blood disorders you have.  Any surgeries you have had.  Any medical conditions you have.  Any problems you have had passing stool. What are the risks? Generally, this is a safe procedure. However, problems may occur, including:  Bleeding.  A tear in the intestine.  A reaction to medicines given during the exam.  Infection (rare).  What happens before the procedure? Eating and drinking restrictions Follow instructions from your health care provider about eating and drinking, which may include:  A few days before the procedure - follow a low-fiber diet. Avoid nuts, seeds, dried fruit, raw fruits, and vegetables.  1-3 days before the procedure - follow a clear liquid diet. Drink only clear liquids, such as clear broth or bouillon, black coffee or tea, clear juice, clear soft drinks or sports drinks, gelatin dessert, and popsicles. Avoid any liquids that contain red or purple dye.  On the day of the procedure - do not eat or drink  anything during the 2 hours before the procedure, or within the time period that your health care provider recommends.  Bowel prep If you were prescribed an oral bowel prep to clean out your colon:  Take it as told by your health care provider. Starting the day before your procedure, you will need to drink a large amount of medicated liquid. The liquid will cause you to have multiple loose stools until your stool is almost clear or light green.  If your skin or anus gets irritated from diarrhea, you may use these to relieve the irritation: ? Medicated wipes, such as adult wet wipes with aloe and vitamin E. ? A skin soothing-product like petroleum jelly.  If you vomit while drinking the bowel prep, take a break for up to 60 minutes and then begin the bowel prep again. If vomiting continues and you cannot take the bowel prep without vomiting, call your health care provider.  General instructions  Ask your health care provider about changing or stopping your regular medicines. This is especially important if you are taking diabetes medicines or blood thinners.  Plan to have someone take you home from the hospital or clinic. What happens during the procedure?  An IV tube may be inserted into one of your veins.  You will be given medicine to help you relax (sedative).  To reduce your risk of infection: ? Your health care team will wash or sanitize their hands. ? Your anal area will be washed with soap.  You will be asked to lie on your side with your knees bent.  Your  health care provider will lubricate a long, thin, flexible tube. The tube will have a camera and a light on the end.  The tube will be inserted into your anus.  The tube will be gently eased through your rectum and colon.  Air will be delivered into your colon to keep it open. You may feel some pressure or cramping.  The camera will be used to take images during the procedure.  A small tissue sample may be removed  from your body to be examined under a microscope (biopsy). If any potential problems are found, the tissue will be sent to a lab for testing.  If small polyps are found, your health care provider may remove them and have them checked for cancer cells.  The tube that was inserted into your anus will be slowly removed. The procedure may vary among health care providers and hospitals. What happens after the procedure?  Your blood pressure, heart rate, breathing rate, and blood oxygen level will be monitored until the medicines you were given have worn off.  Do not drive for 24 hours after the exam.  You may have a small amount of blood in your stool.  You may pass gas and have mild abdominal cramping or bloating due to the air that was used to inflate your colon during the exam.  It is up to you to get the results of your procedure. Ask your health care provider, or the department performing the procedure, when your results will be ready. This information is not intended to replace advice given to you by your health care provider. Make sure you discuss any questions you have with your health care provider. Document Released: 03/18/2000 Document Revised: 01/20/2016 Document Reviewed: 06/02/2015 Elsevier Interactive Patient Education  2018 [employer].

## 2017-04-06 NOTE — Progress Notes (Signed)
Patient ID: Marisa Marsh, female   DOB: [birth year], 72 y.o.   MRN: [id number]  Chief Complaint  Patient presents with  . Follow-up    HPI Marisa Marsh is a 72 y.o. female who presents for a breast evaluation. The most recent mammogram was done on 03/13/2017 .  Patient does perform regular self breast checks and gets regular mammograms done.    HPI  Past Medical History:  Diagnosis Date  . Arthritis   . Breast cancer ([place]) 2009   Left- radiation  . Breast screening, unspecified   . Cancer [facility]) 2009   left breast wide excision with sn bx left axillary dissection. The nodes were clinically positive but negative on path review. She had 1.7 cm tumor that was ER positive and not oveerly expressing for HER2/neu. Her oncotype dx testing put her at low risk for metastatic disease.  . Diabetes mellitus without complication ([place])   . Disc 2009   bulging discs  . Gout 2005  . Hypertension   . Liver disease   . Lump or mass in breast   . Malignant neoplasm of upper-outer quadrant of female breast ([place])   . Obesity, unspecified   . Personal history of malignant neoplasm of breast 2009   left breast  . Personal history of radiation therapy   . Special screening for malignant neoplasms, colon     Past Surgical History:  Procedure Laterality Date  . BACK SURGERY    . BREAST BIOPSY Left 2009   positive  . BREAST BIOPSY Left 2012   neg  . BREAST SURGERY Left 01/08/2008   left breast lumpectomy with sn biopsy  . CARPAL TUNNEL RELEASE Right Dec 2014  . COLONOSCOPY  2013, 2015   Dr. Bary Castilla, 2.5 cm sessile polyp in the cecum, pathology showed a tubular adenoma without dysplasia.  . TUBAL LIGATION      Family History  Problem Relation Age of Onset  . Breast cancer Neg Hx     Social History Social History   Tobacco Use  . Smoking status: Former Research scientist (life sciences)  . Smokeless tobacco: Former Systems developer    Quit date: 04/05/1971  Substance Use Topics  . Alcohol use: No  . Drug use: No     Allergies  Allergen Reactions  . Simvastatin     Other reaction(s): MUSCLE PAIN    Current Outpatient Medications  Medication Sig Dispense Refill  . allopurinol (ZYLOPRIM) 300 MG tablet Take 300 mg by mouth daily.    [name] aspirin 81 MG tablet Take 81 mg by mouth daily.    . Cholecalciferol (VITAMIN D3) 1000 UNITS CAPS Take 1 capsule by mouth daily.    [name] glimepiride (AMARYL) 4 MG tablet Take 4 mg by mouth 2 (two) times daily.    . Glucosamine-Chondroitin (OSTEO BI-FLEX REGULAR STRENGTH PO) Take by mouth daily.    [name] HYDROcodone-acetaminophen (LORTAB) 7.5-500 MG per tablet Take 1 tablet by mouth 2 (two) times daily.     [name] lisinopril-hydrochlorothiazide (PRINZIDE,ZESTORETIC) 20-12.5 MG per tablet Take 1 tablet by mouth daily.    . metFORMIN (GLUCOPHAGE) 1000 MG tablet Take 1,000 mg by mouth 2 (two) times daily with a meal.     . nortriptyline (PAMELOR) 10 MG capsule Take 10 mg by mouth at bedtime.    . pravastatin (PRAVACHOL) 40 MG tablet Take 1 tablet by mouth 2 (two) times daily at 10 AM and 5 PM.    . Thiamine HCl (VITAMIN B-1) 250 MG tablet Take 250 mg  by mouth daily.    . polyethylene glycol powder (GLYCOLAX/MIRALAX) powder 255 grams one bottle for colonoscopy prep 255 g 0   No current facility-administered medications for this visit.     Review of Systems Review of Systems  Constitutional: Negative.   Respiratory: Negative.   Cardiovascular: Negative.     Blood pressure 140/76, pulse (!) 125, resp. rate 12, height _0  (1.626 m), weight 170 lb (77.1 kg).  Physical Exam Physical Exam  Constitutional: She is oriented to person, place, and time. She appears well-developed and well-nourished.  HENT:  Mouth/Throat: Oropharynx is clear and moist.  Eyes: Conjunctivae are normal. No scleral icterus.  Neck: Neck supple.  Cardiovascular: Normal rate, regular rhythm and normal heart sounds.  Pulmonary/Chest: Effort normal and breath sounds normal. No respiratory distress. Right  breast exhibits no inverted nipple, no mass, no nipple discharge, no skin change and no tenderness. Left breast exhibits no inverted nipple, no mass, no nipple discharge, no skin change and no tenderness.    Lymphadenopathy:    She has no cervical adenopathy.    She has no axillary adenopathy.  Neurological: She is alert and oriented to person, place, and time.  Skin: Skin is warm and dry.  Psychiatric: Her behavior is normal.    Data Reviewed October 02, 2013 colonoscopy:   CECUM POLYP HOT SNARE:  - TUBULAR ADENOMA.  - NEGATIVE FOR HIGH GRADE DYSPLASIA AND MALIGNANCY.  RUB/10/03/2013           .  Received in a formalin filled container labeled Marisa Marsh  and cecum polyp hot snare is a 1.8 x 0.9 x 0.1 cm aggregate of  pink tissue fragments and yellow fecal material, filtered into  a mesh bag, and entirely submitted in block A1.   Bilateral mammogram dated March 13, 2017 was reviewed.  BI-RADS-1.  Postsurgical and radiation changes noted.  Assessment    Stable breast exam.  Candidate for follow-up colonoscopy based on large polyp removed in 2015.    Plan    The patient is reluctantly amenable to repeat colonoscopy.    Colonoscopy with possible biopsy/polypectomy prn: Information regarding the procedure, including its potential risks and complications (including but not limited to perforation of the bowel, which may require emergency surgery to repair, and bleeding) was verbally given to the patient. Educational information regarding lower intestinal endoscopy was given to the patient. Written instructions for how to complete the bowel prep using Miralax were provided. The importance of drinking ample fluids to avoid dehydration as a result of the prep emphasized.  The patient has been asked to return to the office in one year with a bilateral screening mammogram. The patient is aware to call back for any questions or concerns.  HPI, Physical Exam, Assessment and  Plan have been scribed under the direction and in the presence of Hervey Ard, MD.  Gaspar Cola, CMA  I have completed the exam and reviewed the above documentation for accuracy and completeness.  I agree with the above.  Haematologist has been used and any errors in dictation or transcription are unintentional.  Hervey Ard, M.D., F.A.C.S.  Robert Bellow 04/06/2017, 11:13 AM  Patient has been scheduled for a colonoscopy on 04-19-17 at [facility]. Miralax prescription has been sent in to the patient's pharmacy today. This patient has been asked to hold metformin day of colonoscopy prep and procedure. Colonoscopy instructions have been reviewed with the patient. This patient is aware to call the office if they have  further questions.   Dominga Ferry, CMA

## 2017-04-17 ENCOUNTER — Telehealth: Payer: Self-pay | Admitting: *Deleted

## 2017-04-17 NOTE — Telephone Encounter (Signed)
Patient called the office today to reschedule colonoscopy. The patient had a death in the family.   The patient's colonoscopy has been moved from 04-19-17 to 05-03-17 at [facility] per patient's request.   [facility] endoscopy has been notified of date change.

## 2017-05-03 ENCOUNTER — Ambulatory Visit: Payer: Medicare Other | Admitting: Anesthesiology

## 2017-05-03 ENCOUNTER — Ambulatory Visit
Admission: RE | Admit: 2017-05-03 | Discharge: 2017-05-03 | Disposition: A | Discharge status: Home / Self Care | Payer: Medicare Other | Source: Ambulatory Visit | Attending: General Surgery | Admitting: General Surgery

## 2017-05-03 ENCOUNTER — Encounter: Payer: Self-pay | Admitting: *Deleted

## 2017-05-03 ENCOUNTER — Encounter
Admission: RE | Disposition: A | Discharge status: Home / Self Care | Payer: Self-pay | Source: Ambulatory Visit | Attending: General Surgery

## 2017-05-03 DIAGNOSIS — Z6828 Body mass index (BMI) 28.0-28.9, adult: Secondary | ICD-10-CM | POA: Diagnosis not present

## 2017-05-03 DIAGNOSIS — M109 Gout, unspecified: Secondary | ICD-10-CM | POA: Diagnosis not present

## 2017-05-03 DIAGNOSIS — Z79899 Other long term (current) drug therapy: Secondary | ICD-10-CM | POA: Diagnosis not present

## 2017-05-03 DIAGNOSIS — I10 Essential (primary) hypertension: Secondary | ICD-10-CM | POA: Diagnosis not present

## 2017-05-03 DIAGNOSIS — E669 Obesity, unspecified: Secondary | ICD-10-CM | POA: Diagnosis not present

## 2017-05-03 DIAGNOSIS — Z86010 Personal history of colonic polyps: Secondary | ICD-10-CM | POA: Diagnosis not present

## 2017-05-03 DIAGNOSIS — Z87891 Personal history of nicotine dependence: Secondary | ICD-10-CM | POA: Insufficient documentation

## 2017-05-03 DIAGNOSIS — Z888 Allergy status to other drugs, medicaments and biological substances status: Secondary | ICD-10-CM | POA: Diagnosis not present

## 2017-05-03 DIAGNOSIS — Z7982 Long term (current) use of aspirin: Secondary | ICD-10-CM | POA: Insufficient documentation

## 2017-05-03 DIAGNOSIS — Z7984 Long term (current) use of oral hypoglycemic drugs: Secondary | ICD-10-CM | POA: Insufficient documentation

## 2017-05-03 DIAGNOSIS — E119 Type 2 diabetes mellitus without complications: Secondary | ICD-10-CM | POA: Diagnosis not present

## 2017-05-03 DIAGNOSIS — Z1211 Encounter for screening for malignant neoplasm of colon: Secondary | ICD-10-CM | POA: Insufficient documentation

## 2017-05-03 HISTORY — PX: COLONOSCOPY WITH PROPOFOL: SHX5780

## 2017-05-03 LAB — GLUCOSE, CAPILLARY: GLUCOSE-CAPILLARY: 260 mg/dL — AB (ref 65–99)

## 2017-05-03 SURGERY — COLONOSCOPY WITH PROPOFOL
Anesthesia: General

## 2017-05-03 MED ORDER — SODIUM CHLORIDE 0.9 % IV SOLN
INTRAVENOUS | Status: DC
  Administered 2017-05-03: 09:00:00 via INTRAVENOUS
  Administered 2017-05-03: 1000 mL via INTRAVENOUS

## 2017-05-03 MED ORDER — PROPOFOL 10 MG/ML IV BOLUS
INTRAVENOUS | Status: DC | PRN
  Administered 2017-05-03: 70 mg via INTRAVENOUS
  Administered 2017-05-03: 30 mg via INTRAVENOUS

## 2017-05-03 MED ORDER — PROPOFOL 500 MG/50ML IV EMUL
INTRAVENOUS | Status: AC
  Filled 2017-05-03: qty 50

## 2017-05-03 MED ORDER — PROPOFOL 500 MG/50ML IV EMUL
INTRAVENOUS | Status: DC | PRN
  Administered 2017-05-03: 150 ug/kg/min via INTRAVENOUS

## 2017-05-03 NOTE — Op Note (Signed)
[facility] Patient Name: Marisa Marsh Procedure Date: 05/03/2017 8:59 AM MRN: [id number] Account #: [id number] Date of Birth: July 05, 1945 Admit Type: Outpatient Age: 72 Room: [facility] ENDO ROOM 1 Gender: Female Note Status: Finalized Procedure:            Colonoscopy Indications:          High risk colon cancer surveillance: Personal history                        of colonic polyps Providers:            Robert Bellow, MD Referring MD:         Jacklynn Barnacle, MD (Referring MD) Medicines:            Monitored Anesthesia Care Complications:        No immediate complications. Procedure:            Pre-Anesthesia Assessment:                       - Prior to the procedure, a History and Physical was                        performed, and patient medications, allergies and                        sensitivities were reviewed. The patient's tolerance of                        previous anesthesia was reviewed.                       - The risks and benefits of the procedure and the                        sedation options and risks were discussed with the                        patient. All questions were answered and informed                        consent was obtained.                       After obtaining informed consent, the colonoscope was                        passed under direct vision. Throughout the procedure,                        the patient's blood pressure, pulse, and oxygen                        saturations were monitored continuously. The                        Colonoscope was introduced through the anus and                        advanced to the the cecum, identified by appendiceal  orifice and ileocecal valve. The colonoscopy was                        performed without difficulty. The patient tolerated the                        procedure well. The quality of the bowel preparation                        was  excellent. Findings:      The entire examined colon appeared normal on direct and retroflexion       views. Impression:           - The entire examined colon is normal on direct and                        retroflexion views.                       - No specimens collected. Recommendation:       - Repeat colonoscopy in 5 years for surveillance. Procedure Code(s):    --- Professional ---                       [phone], Colonoscopy, flexible; diagnostic, including                        collection of specimen(s) by brushing or washing, when                        performed (separate procedure) Diagnosis Code(s):    --- Professional ---                       Z86.010, Personal history of colonic polyps CPT copyright 2016 American Medical Association. All rights reserved. The codes documented in this report are preliminary and upon coder review may  be revised to meet current compliance requirements. Robert Bellow, MD 05/03/2017 9:24:39 AM This report has been signed electronically. Number of Addenda: 0 Note Initiated On: 05/03/2017 8:59 AM Scope Withdrawal Time: 0 hours 9 minutes 1 second  Total Procedure Duration: 0 hours 16 minutes 46 seconds       [facility]

## 2017-05-03 NOTE — Transfer of Care (Signed)
Immediate Anesthesia Transfer of Care Note  Patient: Marisa Marsh  Procedure(s) Performed: COLONOSCOPY WITH PROPOFOL (N/A )  Patient Location: Endoscopy Unit  Anesthesia Type:General  Level of Consciousness: sedated  Airway & Oxygen Therapy: Patient Spontanous Breathing and Patient connected to nasal cannula oxygen  Post-op Assessment: Report given to RN and Post -op Vital signs reviewed and stable  Post vital signs: Reviewed and stable  Last Vitals:  Vitals:   05/03/17 0940 05/03/17 0950  BP: 116/62 122/67  Pulse: 85 76  Resp: 18 16  Temp:    SpO2: 99% 99%    Last Pain:  Vitals:   05/03/17 0920  TempSrc: Tympanic         Complications: No apparent anesthesia complications

## 2017-05-03 NOTE — H&P (Signed)
Bita Cartwright [place] [id number]      HPI:  Fraser Din history cecal polyp. For follow up. Nausea without vomiting with prep.   Medications Prior to Admission  Medication Sig Dispense Refill Last Dose  . allopurinol (ZYLOPRIM) 300 MG tablet Take 300 mg by mouth daily.   Taking  . aspirin 81 MG tablet Take 81 mg by mouth daily.   Taking  . Cholecalciferol (VITAMIN D3) 1000 UNITS CAPS Take 1 capsule by mouth daily.   Taking  . glimepiride (AMARYL) 4 MG tablet Take 4 mg by mouth 2 (two) times daily.   Taking  . Glucosamine-Chondroitin (OSTEO BI-FLEX REGULAR STRENGTH PO) Take by mouth daily.   Taking  . HYDROcodone-acetaminophen (LORTAB) 7.5-500 MG per tablet Take 1 tablet by mouth 2 (two) times daily.    Taking  . lisinopril-hydrochlorothiazide (PRINZIDE,ZESTORETIC) 20-12.5 MG per tablet Take 1 tablet by mouth daily.   Taking  . metFORMIN (GLUCOPHAGE) 1000 MG tablet Take 1,000 mg by mouth 2 (two) times daily with a meal.    Taking  . nortriptyline (PAMELOR) 10 MG capsule Take 10 mg by mouth at bedtime.   Taking  . polyethylene glycol powder (GLYCOLAX/MIRALAX) powder 255 grams one bottle for colonoscopy prep 255 g 0   . pravastatin (PRAVACHOL) 40 MG tablet Take 1 tablet by mouth 2 (two) times daily at 10 AM and 5 PM.   Taking  . Thiamine HCl (VITAMIN B-1) 250 MG tablet Take 250 mg by mouth daily.   Taking   Allergies  Allergen Reactions  . Simvastatin     Other reaction(s): MUSCLE PAIN   Past Medical History:  Diagnosis Date  . Arthritis   . Breast cancer ([place]) 2009   Left- radiation  . Breast screening, unspecified   . Cancer [facility]) 2009   left breast wide excision with sn bx left axillary dissection. The nodes were clinically positive but negative on path review. She had 1.7 cm tumor that was ER positive and not oveerly expressing for HER2/neu. Her oncotype dx testing put her at low risk for metastatic disease.  . Diabetes mellitus without complication ([place])   . Disc 2009   bulging  discs  . Gout 2005  . Hypertension   . Liver disease   . Lump or mass in breast   . Malignant neoplasm of upper-outer quadrant of female breast ([place])   . Obesity, unspecified   . Personal history of malignant neoplasm of breast 2009   left breast  . Personal history of radiation therapy   . Special screening for malignant neoplasms, colon    Past Surgical History:  Procedure Laterality Date  . BACK SURGERY    . BREAST BIOPSY Left 2009   positive  . BREAST BIOPSY Left 2012   neg  . BREAST SURGERY Left 01/08/2008   left breast lumpectomy with sn biopsy  . CARPAL TUNNEL RELEASE Right Dec 2014  . COLONOSCOPY  2013, 2015   Dr. Bary Castilla, 2.5 cm sessile polyp in the cecum, pathology showed a tubular adenoma without dysplasia.  . TUBAL LIGATION     Social History   Socioeconomic History  . Marital status: Married    Spouse name: Not on file  . Number of children: Not on file  . Years of education: Not on file  . Highest education level: Not on file  Social Needs  . Financial resource strain: Not on file  . Food insecurity - worry: Not on file  . Food insecurity - inability: Not  on file  . Transportation needs - medical: Not on file  . Transportation needs - non-medical: Not on file  Occupational History  . Not on file  Tobacco Use  . Smoking status: Former Research scientist (life sciences)  . Smokeless tobacco: Former Systems developer    Quit date: 04/05/1971  Substance and Sexual Activity  . Alcohol use: No  . Drug use: No  . Sexual activity: Not on file  Other Topics Concern  . Not on file  Social History Narrative  . Not on file   Social History   Social History Narrative  . Not on file     ROS: Negative.     PE: HEENT: Negative. Lungs: Clear. Cardio: RR.  Assessment/Plan:  Proceed with planned endoscopy. Forest Gleason Aby Gessel 05/03/2017

## 2017-05-03 NOTE — Anesthesia Postprocedure Evaluation (Signed)
Anesthesia Post Note  Patient: Marisa Marsh  Procedure(s) Performed: COLONOSCOPY WITH PROPOFOL (N/A )  Patient location during evaluation: Endoscopy Anesthesia Type: General Level of consciousness: awake and alert Pain management: pain level controlled Vital Signs Assessment: post-procedure vital signs reviewed and stable Respiratory status: spontaneous breathing, nonlabored ventilation, respiratory function stable and patient connected to nasal cannula oxygen Cardiovascular status: blood pressure returned to baseline and stable Postop Assessment: no apparent nausea or vomiting Anesthetic complications: no     Last Vitals:  Vitals:   05/03/17 0940 05/03/17 0950  BP: 116/62 122/67  Pulse: 85 76  Resp: 18 16  Temp:    SpO2: 99% 99%    Last Pain:  Vitals:   05/03/17 0920  TempSrc: Tympanic                 Precious Haws Piscitello

## 2017-05-03 NOTE — Anesthesia Post-op Follow-up Note (Signed)
Anesthesia QCDR form completed.        

## 2017-05-03 NOTE — Anesthesia Preprocedure Evaluation (Signed)
Anesthesia Evaluation  Patient identified by MRN, date of birth, ID band Patient awake    Reviewed: Allergy & Precautions, H&P , NPO status , Patient's Chart, lab work & pertinent test results  History of Anesthesia Complications Negative for: history of anesthetic complications  Airway Mallampati: III  TM Distance: <3 FB Neck ROM: limited    Dental  (+) Chipped, Poor Dentition, Missing, Partial Upper   Pulmonary neg shortness of breath, former smoker,           Cardiovascular Exercise Tolerance: Good hypertension, (-) angina(-) Past MI and (-) DOE      Neuro/Psych negative neurological ROS  negative psych ROS   GI/Hepatic negative GI ROS, Neg liver ROS,   Endo/Other  diabetes, Type 2, Oral Hypoglycemic Agents  Renal/GU negative Renal ROS  negative genitourinary   Musculoskeletal   Abdominal   Peds  Hematology negative hematology ROS (+)   Anesthesia Other Findings Past Medical History: No date: Arthritis 2009: Breast cancer ([place])     Comment:  Left- radiation No date: Breast screening, unspecified 2009: Cancer ([place])     Comment:  left breast wide excision with sn bx left axillary               dissection. The nodes were clinically positive but               negative on path review. She had 1.7 cm tumor that was ER              positive and not oveerly expressing for HER2/neu. Her               oncotype dx testing put her at low risk for metastatic               disease. No date: Diabetes mellitus without complication ([place]) 1610: Disc     Comment:  bulging discs 2005: Gout No date: Hypertension No date: Liver disease No date: Lump or mass in breast No date: Malignant neoplasm of upper-outer quadrant of female breast  ([place]) No date: Obesity, unspecified 2009: Personal history of malignant neoplasm of breast     Comment:  left breast No date: Personal history of radiation therapy No date: Special  screening for malignant neoplasms, colon  Past Surgical History: No date: BACK SURGERY 2009: BREAST BIOPSY; Left     Comment:  positive 2012: BREAST BIOPSY; Left     Comment:  neg 01/08/2008: BREAST SURGERY; Left     Comment:  left breast lumpectomy with sn biopsy Dec 2014: CARPAL TUNNEL RELEASE; Right 2013, 2015: COLONOSCOPY     Comment:  Dr. Bary Castilla, 2.5 cm sessile polyp in the cecum,               pathology showed a tubular adenoma without dysplasia. No date: TUBAL LIGATION  BMI    Body Mass Index:  28.29 kg/m      Reproductive/Obstetrics negative OB ROS                             Anesthesia Physical Anesthesia Plan  ASA: III  Anesthesia Plan: General   Post-op Pain Management:    Induction: Intravenous  PONV Risk Score and Plan: Propofol infusion  Airway Management Planned: Natural Airway and Nasal Cannula  Additional Equipment:   Intra-op Plan:   Post-operative Plan:   Informed Consent: I have reviewed the patients History and Physical, chart, labs and discussed the procedure including  the risks, benefits and alternatives for the proposed anesthesia with the patient or authorized representative who has indicated his/her understanding and acceptance.   Dental Advisory Given  Plan Discussed with: Anesthesiologist, CRNA and Surgeon  Anesthesia Plan Comments: (Patient consented for risks of anesthesia including but not limited to:  - adverse reactions to medications - risk of intubation if required - damage to teeth, lips or other oral mucosa - sore throat or hoarseness - Damage to heart, brain, lungs or loss of life  Patient voiced understanding.)        Anesthesia Quick Evaluation

## 2017-05-04 ENCOUNTER — Encounter: Payer: Self-pay | Admitting: General Surgery

## 2017-12-27 DIAGNOSIS — M47816 Spondylosis without myelopathy or radiculopathy, lumbar region: Secondary | ICD-10-CM | POA: Insufficient documentation

## 2018-01-16 ENCOUNTER — Encounter: Payer: Self-pay | Admitting: General Surgery

## 2018-01-16 ENCOUNTER — Ambulatory Visit: Payer: Self-pay

## 2018-01-16 ENCOUNTER — Ambulatory Visit (INDEPENDENT_AMBULATORY_CARE_PROVIDER_SITE_OTHER): Payer: Medicare Other | Admitting: General Surgery

## 2018-01-16 VITALS — BP 178/93 | HR 120 | Temp 97.7°F | Resp 13 | Ht 64.0 in | Wt 168.0 lb

## 2018-01-16 DIAGNOSIS — N6459 Other signs and symptoms in breast: Secondary | ICD-10-CM

## 2018-01-16 NOTE — Patient Instructions (Addendum)
Patient to have a right diagnotic mammogram, possible right breast biopsy at [facility].  The patient is aware to call back for any questions or concerns.  The patient is scheduled for right breast mammogram at [facility] on 02/07/18 at 10:40 am. Pending the results the patient is scheduled for surgery at [facility] on 02/12/18 with Dr Bary Castilla. She will pre admit at the hospital for this. The patient is aware of dates, times, and instructions.

## 2018-01-16 NOTE — Progress Notes (Addendum)
Patient ID: Marisa Marsh, female   DOB: 1945-09-21, 72 y.o.   MRN: [id number]  Chief Complaint  Patient presents with  . Follow-up    HPI Marisa Marsh is a 72 y.o. female here today for right breast bleeding. Patient states the bleeding has been going on for three week. This week she has not noticed any. No injury to breast. HPI  Past Medical History:  Diagnosis Date  . Arthritis   . Breast cancer ([place]) 2009   Left- radiation  . Breast screening, unspecified   . Cancer [facility]) 2009   left breast wide excision with sn bx left axillary dissection. The nodes were clinically positive but negative on path review. She had 1.7 cm tumor that was ER positive and not oveerly expressing for HER2/neu. Her oncotype dx testing put her at low risk for metastatic disease.  . Diabetes mellitus without complication ([place])   . Disc 2009   bulging discs  . Gout 2005  . Hypertension   . Liver disease   . Lump or mass in breast   . 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 Obesity, unspecified   . Personal history of malignant neoplasm of breast 2009   left breast  . Personal history of radiation therapy   . Special screening for malignant neoplasms, colon     Past Surgical History:  Procedure Laterality Date  . BACK SURGERY    . BREAST BIOPSY Left 2009   positive  . BREAST BIOPSY Left 2012   neg  . BREAST SURGERY Left 01/08/2008   left breast lumpectomy with sn biopsy  . CARPAL TUNNEL RELEASE Right Dec 2014  . COLONOSCOPY  2013, 2015   Dr. Bary Castilla, 2.5 cm sessile polyp in the cecum, pathology showed a tubular adenoma without dysplasia.  . COLONOSCOPY WITH PROPOFOL N/A 05/03/2017   Procedure: COLONOSCOPY WITH PROPOFOL;  Surgeon: Robert Bellow, MD;  Location: [facility] ENDOSCOPY;  Service: Endoscopy;  Laterality: N/A;  . TUBAL LIGATION      Family History  Problem Relation Age of Onset  . Breast cancer Neg Hx     Social History Social History   Tobacco  Use  . Smoking status: Former Research scientist (life sciences)  . Smokeless tobacco: Former Systems developer    Quit date: 04/05/1971  Substance Use Topics  . Alcohol use: No  . Drug use: No    Allergies  Allergen Reactions  . Simvastatin     Other reaction(s): MUSCLE PAIN    Current Outpatient Medications  Medication Sig Dispense Refill  . allopurinol (ZYLOPRIM) 300 MG tablet Take 300 mg by mouth daily.    [name] aspirin 81 MG tablet Take 81 mg by mouth daily.    . busPIRone (BUSPAR) 15 MG tablet Take by mouth.    . Cholecalciferol (VITAMIN D3) 1000 UNITS CAPS Take 1 capsule by mouth daily.    [name] glimepiride (AMARYL) 4 MG tablet Take 4 mg by mouth 2 (two) times daily.    . Glucosamine-Chondroitin (OSTEO BI-FLEX REGULAR STRENGTH PO) Take by mouth daily.    [name] HYDROcodone-acetaminophen (LORTAB) 7.5-500 MG per tablet Take 1 tablet by mouth 2 (two) times daily.     [name] lisinopril-hydrochlorothiazide (PRINZIDE,ZESTORETIC) 20-12.5 MG per tablet Take 1 tablet by mouth daily.    . metFORMIN (GLUCOPHAGE) 1000 MG tablet Take 1,000 mg by mouth 2 (two) times daily with a meal.     . nortriptyline (PAMELOR) 10 MG capsule Take 10 mg by mouth at bedtime.    [name]  pravastatin (PRAVACHOL) 40 MG tablet Take 1 tablet by mouth 2 (two) times daily at 10 AM and 5 PM.    . Thiamine HCl (VITAMIN B-1) 250 MG tablet Take 250 mg by mouth daily.     No current facility-administered medications for this visit.     Review of Systems Review of Systems  Constitutional: Negative.   Respiratory: Negative.   Cardiovascular: Negative.     Blood pressure (!) 178/93, pulse (!) 120, temperature 97.7 F (36.5 C), temperature source Skin, resp. rate 13, height '5\' 4"'$  (1.626 m), weight 168 lb (76.2 kg).  Physical Exam Physical Exam  Constitutional: She is oriented to person, place, and time. She appears well-developed and well-nourished.  Eyes: Conjunctivae are normal. No scleral icterus.  Neck: Normal range of motion.  Cardiovascular: Normal rate, regular  rhythm and normal heart sounds.  Pulmonary/Chest: Effort normal and breath sounds normal. Right breast exhibits no inverted nipple, no mass, no nipple discharge, no skin change and no tenderness. Left breast exhibits nipple discharge. Left breast exhibits no inverted nipple, no mass, no skin change and no tenderness.    Lymphadenopathy:    She has no cervical adenopathy.  Neurological: She is alert and oriented to person, place, and time.  Skin: Skin is warm and dry.    Data Reviewed Bilateral screening mammograms dated March 14, 2017 were reviewed.  No change in the retroareolar tissue on the right side noted from prior year.  Minimal prominence compared to the right.  BI-RADS-1.   Ultrasound examination of the right breast was completed to assess for potential source for her new onset nipple drainage.  Multiple dilated ducts encompassing an area of 1.1 x 1.79 x 2.06 cm is appreciated in the retroareolar area.  Largest ductal diameter is 0.47 cm.  No intraluminal lesions are appreciated.  Laboratory studies from [facility] dated December 27, 2017 showed a creatinine of 1.17 with an estimated GFR of 47.  No change from the previous year. Hemoglobin A1c elevated 8.9, improved from 9.6 in June. Assessment    New onset right breast nipple drainage without history of trauma.    Plan  We will arrange for a right breast diagnostic mammogram followed by excision of the retroareolar ductal tissue.  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 Patient to have a right diagnotic mammogram, possible right breast biopsy at [facility].  The patient is aware to call back for any questions or concerns.   HPI, Physical Exam, Assessment and Plan have been scribed under the direction and in the presence of Hervey Ard, MD. Gaspar Cola, CMA  The patient is scheduled for  right breast mammogram at [facility] on 02/07/18 at 10:40 am. Pending the results the patient is scheduled for surgery at [facility] on 02/12/18 with Dr Bary Castilla. She will pre admit at the hospital for this. The patient is aware of dates, times, and instructions.  Documented by Caryl-Lyn Otis Brace LPN  Forest Gleason Korby Ratay 01/16/2018, 8:04 PM

## 2018-01-18 ENCOUNTER — Telehealth: Payer: Self-pay

## 2018-01-18 NOTE — Telephone Encounter (Signed)
The patient will pre admit at the hospital in the [place] on 02/06/18 at 9:00 am. The patient is aware of date, time, and instructions.

## 2018-02-06 ENCOUNTER — Inpatient Hospital Stay: Admission: RE | Admit: 2018-02-06 | Payer: Medicare Other | Source: Ambulatory Visit

## 2018-02-07 ENCOUNTER — Other Ambulatory Visit: Payer: Medicare Other

## 2018-02-07 ENCOUNTER — Other Ambulatory Visit: Payer: Self-pay

## 2018-02-07 ENCOUNTER — Ambulatory Visit
Admission: RE | Admit: 2018-02-07 | Discharge: 2018-02-07 | Disposition: A | Discharge status: Home / Self Care | Payer: Medicare Other | Source: Ambulatory Visit | Attending: General Surgery | Admitting: General Surgery

## 2018-02-07 ENCOUNTER — Encounter
Admission: RE | Admit: 2018-02-07 | Discharge: 2018-02-07 | Disposition: A | Discharge status: Home / Self Care | Payer: Medicare Other | Source: Ambulatory Visit | Attending: General Surgery | Admitting: General Surgery

## 2018-02-07 ENCOUNTER — Encounter: Payer: Self-pay | Admitting: *Deleted

## 2018-02-07 DIAGNOSIS — N6459 Other signs and symptoms in breast: Secondary | ICD-10-CM

## 2018-02-07 LAB — CBC WITH DIFFERENTIAL/PLATELET
ABS IMMATURE GRANULOCYTES: 0.04 10*3/uL (ref 0.00–0.07)
BASOS ABS: 0.1 10*3/uL (ref 0.0–0.1)
BASOS PCT: 1 %
Eosinophils Absolute: 0.3 10*3/uL (ref 0.0–0.5)
Eosinophils Relative: 4 %
HCT: 36.4 % (ref 36.0–46.0)
HEMOGLOBIN: 11.4 g/dL — AB (ref 12.0–15.0)
Immature Granulocytes: 0 %
LYMPHS PCT: 34 %
Lymphs Abs: 3.3 10*3/uL (ref 0.7–4.0)
MCH: 28.7 pg (ref 26.0–34.0)
MCHC: 31.3 g/dL (ref 30.0–36.0)
MCV: 91.7 fL (ref 80.0–100.0)
Monocytes Absolute: 0.5 10*3/uL (ref 0.1–1.0)
Monocytes Relative: 5 %
NEUTROS ABS: 5.6 10*3/uL (ref 1.7–7.7)
NEUTROS PCT: 56 %
NRBC: 0 % (ref 0.0–0.2)
PLATELETS: 290 10*3/uL (ref 150–400)
RBC: 3.97 MIL/uL (ref 3.87–5.11)
RDW: 15.2 % (ref 11.5–15.5)
WBC: 9.8 10*3/uL (ref 4.0–10.5)

## 2018-02-07 LAB — BASIC METABOLIC PANEL
ANION GAP: 9 (ref 5–15)
BUN: 19 mg/dL (ref 8–23)
CHLORIDE: 106 mmol/L (ref 98–111)
CO2: 28 mmol/L (ref 22–32)
Calcium: 9.9 mg/dL (ref 8.9–10.3)
Creatinine, Ser: 1.06 mg/dL — ABNORMAL HIGH (ref 0.44–1.00)
GFR calc non Af Amer: 52 mL/min — ABNORMAL LOW (ref >60)
GFR, EST AFRICAN AMERICAN: 60 mL/min — AB (ref >60)
Glucose, Bld: 113 mg/dL — ABNORMAL HIGH (ref 70–99)
POTASSIUM: 4.3 mmol/L (ref 3.5–5.1)
SODIUM: 143 mmol/L (ref 135–145)

## 2018-02-07 NOTE — Patient Instructions (Signed)
Your procedure is scheduled on: 02/12/18 Report to Day Surgery.[facility] SECOND FLOOR To find out your arrival time please call [phone] between 1PM - 3PM on 02/09/18  Remember: Instructions that are not followed completely may result in serious medical risk,  up to and including death, or upon the discretion of your surgeon and anesthesiologist your  surgery may need to be rescheduled.     _X__ 1. Do not eat food after midnight the night before your procedure.                 No gum chewing or hard candies. You may drink clear liquids up to 2 hours                 before you are scheduled to arrive for your surgery- DO not drink clear                 liquids within 2 hours of the start of your surgery.                 Clear Liquids include:  water, apple juice without pulp, clear carbohydrate                 drink such as Clearfast of Gatorade, Black Coffee or Tea (Do not add                 anything to coffee or tea).  __X__2.  On the morning of surgery brush your teeth with toothpaste and water, you                may rinse your mouth with mouthwash if you wish.  Do not swallow any toothpaste of mouthwash.     _X__ 3.  No Alcohol for 24 hours before or after surgery.   _X__ 4.  Do Not Smoke or use e-cigarettes For 24 Hours Prior to Your Surgery.                 Do not use any chewable tobacco products for at least 6 hours prior to                 surgery.  ____  5.  Bring all medications with you on the day of surgery if instructed.   __X__  6.  Notify your doctor if there is any change in your medical condition      (cold, fever, infections).     Do not wear jewelry, make-up, hairpins, clips or nail polish. Do not wear lotions, powders, or perfumes. You may wear deodorant. Do not shave 48 hours prior to surgery. Men may shave face and neck. Do not bring valuables to the hospital.    [facility] is not responsible for any belongings or  valuables.  Contacts, dentures or bridgework may not be worn into surgery. Leave your suitcase in the car. After surgery it may be brought to your room. For patients admitted to the hospital, discharge time is determined by your treatment team.   Patients discharged the day of surgery will not be allowed to drive home.   Please read over the following fact sheets that you were given:   Surgical Site Infection Prevention     _X_ Take these medicines the morning of surgery with A SIP OF WATER:    1. BUSPIRION  2. LEVTIRACETAM  3.   4.  5.  6.  ____ Fleet Enema (as directed)   _X___ Use CHG Soap as directed  ____ Use inhalers on the day of surgery  __X__ Stop metformin 2 days prior to surgery    ____ Take 1/2 of usual insulin dose the night before surgery. No insulin the morning          of surgery.   ____ Stop Coumadin/Plavix/aspirin on  ____ Stop Anti-inflammatories on    ____ Stop supplements until after surgery.    ____ Bring C-Pap to the hospital.

## 2018-02-07 NOTE — Progress Notes (Signed)
Per Caryl Pina in the Pre-admission Testing Department, patient was a no show for pre-admit appointment yesterday.   I did call and speak with the patient's husband who reports she is currently having her mammogram.  Husband states that they got their wires crossed and she went to Pre-admit today and was told she needed to have mammogram first. The husband wanted to know if she could be seen after mammogram done today.   I called Caryl Pina back at [employer] and she stated that patient can come over and Pre-admit after mammogram but there will be a wait.   Patient's husband notified accordingly and agrees to this.

## 2018-02-11 MED ORDER — CEFAZOLIN SODIUM-DEXTROSE 2-4 GM/100ML-% IV SOLN
2.0000 g | INTRAVENOUS | Status: AC
  Administered 2018-02-12: 2 g via INTRAVENOUS

## 2018-02-12 ENCOUNTER — Encounter: Payer: Self-pay | Admitting: Certified Registered Nurse Anesthetist

## 2018-02-12 ENCOUNTER — Ambulatory Visit: Payer: Medicare Other | Admitting: Certified Registered Nurse Anesthetist

## 2018-02-12 ENCOUNTER — Ambulatory Visit
Admission: RE | Admit: 2018-02-12 | Discharge: 2018-02-12 | Disposition: A | Discharge status: Home / Self Care | Payer: Medicare Other | Source: Ambulatory Visit | Attending: General Surgery | Admitting: General Surgery

## 2018-02-12 ENCOUNTER — Encounter
Admission: RE | Disposition: A | Discharge status: Home / Self Care | Payer: Self-pay | Source: Ambulatory Visit | Attending: General Surgery

## 2018-02-12 DIAGNOSIS — N6459 Other signs and symptoms in breast: Secondary | ICD-10-CM

## 2018-02-12 DIAGNOSIS — Z853 Personal history of malignant neoplasm of breast: Secondary | ICD-10-CM | POA: Insufficient documentation

## 2018-02-12 DIAGNOSIS — E119 Type 2 diabetes mellitus without complications: Secondary | ICD-10-CM | POA: Insufficient documentation

## 2018-02-12 DIAGNOSIS — Z87891 Personal history of nicotine dependence: Secondary | ICD-10-CM | POA: Insufficient documentation

## 2018-02-12 DIAGNOSIS — N6041 Mammary duct ectasia of right breast: Secondary | ICD-10-CM | POA: Diagnosis not present

## 2018-02-12 DIAGNOSIS — N6031 Fibrosclerosis of right breast: Secondary | ICD-10-CM | POA: Insufficient documentation

## 2018-02-12 DIAGNOSIS — I10 Essential (primary) hypertension: Secondary | ICD-10-CM | POA: Diagnosis not present

## 2018-02-12 DIAGNOSIS — Z79899 Other long term (current) drug therapy: Secondary | ICD-10-CM | POA: Insufficient documentation

## 2018-02-12 DIAGNOSIS — Z7984 Long term (current) use of oral hypoglycemic drugs: Secondary | ICD-10-CM | POA: Insufficient documentation

## 2018-02-12 DIAGNOSIS — Z6827 Body mass index (BMI) 27.0-27.9, adult: Secondary | ICD-10-CM | POA: Insufficient documentation

## 2018-02-12 DIAGNOSIS — E669 Obesity, unspecified: Secondary | ICD-10-CM | POA: Insufficient documentation

## 2018-02-12 DIAGNOSIS — M109 Gout, unspecified: Secondary | ICD-10-CM | POA: Insufficient documentation

## 2018-02-12 DIAGNOSIS — N6452 Nipple discharge: Secondary | ICD-10-CM | POA: Insufficient documentation

## 2018-02-12 DIAGNOSIS — Z923 Personal history of irradiation: Secondary | ICD-10-CM | POA: Diagnosis not present

## 2018-02-12 DIAGNOSIS — N6310 Unspecified lump in the right breast, unspecified quadrant: Secondary | ICD-10-CM

## 2018-02-12 DIAGNOSIS — Z7982 Long term (current) use of aspirin: Secondary | ICD-10-CM | POA: Insufficient documentation

## 2018-02-12 HISTORY — DX: Unspecified lump in the right breast, unspecified quadrant: N63.10

## 2018-02-12 HISTORY — PX: BREAST BIOPSY: SHX20

## 2018-02-12 LAB — GLUCOSE, CAPILLARY
GLUCOSE-CAPILLARY: 191 mg/dL — AB (ref 70–99)
Glucose-Capillary: 163 mg/dL — ABNORMAL HIGH (ref 70–99)

## 2018-02-12 SURGERY — BREAST BIOPSY
Anesthesia: General | Laterality: Right

## 2018-02-12 MED ORDER — ACETAMINOPHEN 10 MG/ML IV SOLN
INTRAVENOUS | Status: DC | PRN
  Administered 2018-02-12: 1000 mg via INTRAVENOUS

## 2018-02-12 MED ORDER — LIDOCAINE HCL (CARDIAC) PF 100 MG/5ML IV SOSY
PREFILLED_SYRINGE | INTRAVENOUS | Status: DC | PRN
  Administered 2018-02-12: 60 mg via INTRAVENOUS

## 2018-02-12 MED ORDER — FENTANYL CITRATE (PF) 100 MCG/2ML IJ SOLN
INTRAMUSCULAR | Status: AC
  Filled 2018-02-12: qty 2

## 2018-02-12 MED ORDER — FAMOTIDINE 20 MG PO TABS
20.0000 mg | ORAL_TABLET | Freq: Once | ORAL | Status: AC
  Administered 2018-02-12: 20 mg via ORAL

## 2018-02-12 MED ORDER — ACETAMINOPHEN 10 MG/ML IV SOLN
INTRAVENOUS | Status: AC
  Filled 2018-02-12: qty 100

## 2018-02-12 MED ORDER — CEFAZOLIN SODIUM-DEXTROSE 2-4 GM/100ML-% IV SOLN
INTRAVENOUS | Status: AC
  Filled 2018-02-12: qty 100

## 2018-02-12 MED ORDER — ONDANSETRON HCL 4 MG/2ML IJ SOLN: 4.0000 mg | Freq: Once | INTRAMUSCULAR | Status: DC | PRN

## 2018-02-12 MED ORDER — PROPOFOL 10 MG/ML IV BOLUS
INTRAVENOUS | Status: AC
  Filled 2018-02-12: qty 20

## 2018-02-12 MED ORDER — SODIUM CHLORIDE 0.9 % IV SOLN
INTRAVENOUS | Status: DC
  Administered 2018-02-12: 07:00:00 via INTRAVENOUS

## 2018-02-12 MED ORDER — FAMOTIDINE 20 MG PO TABS
ORAL_TABLET | ORAL | Status: AC
  Administered 2018-02-12: 20 mg via ORAL
  Filled 2018-02-12: qty 1

## 2018-02-12 MED ORDER — FENTANYL CITRATE (PF) 100 MCG/2ML IJ SOLN: 25.0000 ug | INTRAMUSCULAR | Status: DC | PRN

## 2018-02-12 MED ORDER — LIDOCAINE HCL (PF) 2 % IJ SOLN
INTRAMUSCULAR | Status: AC
  Filled 2018-02-12: qty 10

## 2018-02-12 MED ORDER — PROPOFOL 10 MG/ML IV BOLUS
INTRAVENOUS | Status: DC | PRN
  Administered 2018-02-12: 140 mg via INTRAVENOUS

## 2018-02-12 MED ORDER — BUPIVACAINE-EPINEPHRINE (PF) 0.5% -1:200000 IJ SOLN
INTRAMUSCULAR | Status: AC
  Filled 2018-02-12: qty 30

## 2018-02-12 MED ORDER — MIDAZOLAM HCL 2 MG/2ML IJ SOLN
INTRAMUSCULAR | Status: DC | PRN
  Administered 2018-02-12: 2 mg via INTRAVENOUS

## 2018-02-12 MED ORDER — DEXAMETHASONE SODIUM PHOSPHATE 10 MG/ML IJ SOLN
INTRAMUSCULAR | Status: DC | PRN
  Administered 2018-02-12: 5 mg via INTRAVENOUS

## 2018-02-12 MED ORDER — ONDANSETRON HCL 4 MG/2ML IJ SOLN
INTRAMUSCULAR | Status: AC
  Filled 2018-02-12: qty 2

## 2018-02-12 MED ORDER — BUPIVACAINE-EPINEPHRINE (PF) 0.5% -1:200000 IJ SOLN
INTRAMUSCULAR | Status: DC | PRN
  Administered 2018-02-12: 30 mL

## 2018-02-12 MED ORDER — FENTANYL CITRATE (PF) 100 MCG/2ML IJ SOLN
INTRAMUSCULAR | Status: DC | PRN
  Administered 2018-02-12 (×4): 25 ug via INTRAVENOUS

## 2018-02-12 MED ORDER — MIDAZOLAM HCL 2 MG/2ML IJ SOLN
INTRAMUSCULAR | Status: AC
  Filled 2018-02-12: qty 2

## 2018-02-12 MED ORDER — ONDANSETRON HCL 4 MG/2ML IJ SOLN
INTRAMUSCULAR | Status: DC | PRN
  Administered 2018-02-12: 4 mg via INTRAVENOUS

## 2018-02-12 MED ORDER — DEXAMETHASONE SODIUM PHOSPHATE 10 MG/ML IJ SOLN
INTRAMUSCULAR | Status: AC
  Filled 2018-02-12: qty 1

## 2018-02-12 SURGICAL SUPPLY — 44 items
BINDER BREAST LRG (GAUZE/BANDAGES/DRESSINGS) IMPLANT
BINDER BREAST MEDIUM (GAUZE/BANDAGES/DRESSINGS) IMPLANT
BINDER BREAST XLRG (GAUZE/BANDAGES/DRESSINGS) IMPLANT
BINDER BREAST XXLRG (GAUZE/BANDAGES/DRESSINGS) IMPLANT
BLADE SURG 15 STRL SS SAFETY (BLADE) ×6 IMPLANT
CANISTER SUCT 1200ML W/VALVE (MISCELLANEOUS) ×3 IMPLANT
CHLORAPREP W/TINT 26ML (MISCELLANEOUS) ×3 IMPLANT
CLOSURE WOUND 1/2 X4 (GAUZE/BANDAGES/DRESSINGS) ×1
CNTNR SPEC 2.5X3XGRAD LEK (MISCELLANEOUS) ×1
CONT SPEC 4OZ STER OR WHT (MISCELLANEOUS) ×2
CONTAINER SPEC 2.5X3XGRAD LEK (MISCELLANEOUS) ×1 IMPLANT
COVER PROBE FLX POLY STRL (MISCELLANEOUS) ×3 IMPLANT
COVER WAND RF STERILE (DRAPES) IMPLANT
DEVICE DUBIN SPECIMEN MAMMOGRA (MISCELLANEOUS) ×3 IMPLANT
DRAPE LAPAROTOMY 100X77 ABD (DRAPES) ×3 IMPLANT
DRSG GAUZE FLUFF 36X18 (GAUZE/BANDAGES/DRESSINGS) ×3 IMPLANT
DRSG TELFA 4X3 1S NADH ST (GAUZE/BANDAGES/DRESSINGS) ×3 IMPLANT
ELECT CAUTERY BLADE TIP 2.5 (TIP) ×3
ELECT REM PT RETURN 9FT ADLT (ELECTROSURGICAL) ×3
ELECTRODE CAUTERY BLDE TIP 2.5 (TIP) ×1 IMPLANT
ELECTRODE REM PT RTRN 9FT ADLT (ELECTROSURGICAL) ×1 IMPLANT
GLOVE BIO SURGEON STRL SZ7.5 (GLOVE) ×6 IMPLANT
GLOVE INDICATOR 8.0 STRL GRN (GLOVE) ×6 IMPLANT
GOWN STRL REUS W/ TWL LRG LVL3 (GOWN DISPOSABLE) ×2 IMPLANT
GOWN STRL REUS W/TWL LRG LVL3 (GOWN DISPOSABLE) ×4
KIT TURNOVER KIT A (KITS) ×3 IMPLANT
LABEL OR SOLS (LABEL) ×3 IMPLANT
MARGIN MAP 10MM (MISCELLANEOUS) ×3 IMPLANT
NEEDLE HYPO 22GX1.5 SAFETY (NEEDLE) ×3 IMPLANT
NEEDLE HYPO 25X1 1.5 SAFETY (NEEDLE) IMPLANT
PACK BASIN MINOR ARMC (MISCELLANEOUS) ×3 IMPLANT
RETRACTOR RING XSMALL (MISCELLANEOUS) IMPLANT
RTRCTR WOUND ALEXIS 13CM XS SH (MISCELLANEOUS)
SHEARS HARMONIC 9CM CVD (BLADE) IMPLANT
STRIP CLOSURE SKIN 1/2X4 (GAUZE/BANDAGES/DRESSINGS) ×2 IMPLANT
SUT ETHILON 3-0 FS-10 30 BLK (SUTURE) ×3
SUT VIC AB 2-0 CT1 27 (SUTURE) ×4
SUT VIC AB 2-0 CT1 TAPERPNT 27 (SUTURE) ×2 IMPLANT
SUT VIC AB 4-0 FS2 27 (SUTURE) ×3 IMPLANT
SUTURE EHLN 3-0 FS-10 30 BLK (SUTURE) ×1 IMPLANT
SWABSTK COMLB BENZOIN TINCTURE (MISCELLANEOUS) ×3 IMPLANT
SYR 10ML LL (SYRINGE) ×3 IMPLANT
TAPE TRANSPORE STRL 2 31045 (GAUZE/BANDAGES/DRESSINGS) IMPLANT
WATER STERILE IRR 1000ML POUR (IV SOLUTION) ×3 IMPLANT

## 2018-02-12 NOTE — Transfer of Care (Signed)
Immediate Anesthesia Transfer of Care Note  Patient: Marisa Marsh  Procedure(s) Performed: BREAST BIOPSY (Right )  Patient Location: PACU  Anesthesia Type:General  Level of Consciousness: drowsy  Airway & Oxygen Therapy: Patient Spontanous Breathing and Patient connected to face mask oxygen  Post-op Assessment: Report given to RN and Post -op Vital signs reviewed and stable  Post vital signs: Reviewed and stable  Last Vitals:  Vitals Value Taken Time  BP 181/76 02/12/2018  8:18 AM  Temp 37.3 C 02/12/2018  8:18 AM  Pulse 112 02/12/2018  8:19 AM  Resp 16 02/12/2018  8:19 AM  SpO2 100 % 02/12/2018  8:19 AM  Vitals shown include unvalidated device data.  Last Pain:  Vitals:   02/12/18 0606  TempSrc: Tympanic  PainSc: 10-Worst pain ever         Complications: No apparent anesthesia complications

## 2018-02-12 NOTE — Op Note (Signed)
Preoperative diagnosis: Bloody nipple drainage right breast.  Postoperative diagnosis: Same.  Operative procedure: Excision of right retroareolar ductal structures.  Operating Surgeon: Hervey Ard, MD.  Anesthesia: General by LMA, [place] 0.5% with 1 to 200,000 units of epinephrine, 30 cc.  Estimated blood loss: Less than 2 cc.  Clinical note: This 72 year old woman is developed bloody nipple drainage.  No history of trauma.  Ultrasound showed multiple dilated ducts in the retroareolar area with the largest measuring 0.47 cm in diameter.  Mammograms were noncontributory.  She is admitted for elective excision.  Operative note: The patient underwent general anesthesia and tolerated this well.  Local anesthesia was infiltrated for postoperative comfort.  A circumareolar incision from the 2 to 7 o'clock position was made and carried down through skin subtendinous tissue with hemostasis achieved by electrocautery.  A lacrimal duct probe was placed into the duct draining bloody fluid.  Subcutaneous fat beneath the areola was lifted off the underlying breast parenchyma until the ductal structures were met.  The ductal structures were then resected from the base of the nipple and the dissection continued into the deep tissue for approximately 4 cm.  Multiple ducts with thick, creamy fluid were encountered and the single duct with blood stained fluid noted.  After assuring good hemostasis a 2-0 Vicryl baseball stitch was used to approximate the deep tissue.  Interrupted 2-0 Vicryl figure-of-eight sutures were used to approximate the subcutaneous tissue.  The skin was closed with a running 4-0 Vicryl subcuticular suture.  Benzoin Steri-Strips followed by a Telfa pad, fluff gauze and a compressive wrap were applied.  Patient tolerated the procedure well and was taken to recovery in stable condition.

## 2018-02-12 NOTE — Anesthesia Post-op Follow-up Note (Signed)
Anesthesia QCDR form completed.        

## 2018-02-12 NOTE — Anesthesia Preprocedure Evaluation (Signed)
Anesthesia Evaluation  Patient identified by MRN, date of birth, ID band Patient awake    Reviewed: Allergy & Precautions, H&P , NPO status , Patient's Chart, lab work & pertinent test results, reviewed documented beta blocker date and time   History of Anesthesia Complications Negative for: history of anesthetic complications  Airway Mallampati: I  TM Distance: >3 FB Neck ROM: full    Dental  (+) Dental Advidsory Given, Partial Upper, Partial Lower, Poor Dentition, Missing   Pulmonary neg pulmonary ROS, former smoker,           Cardiovascular Exercise Tolerance: Good hypertension, (-) angina(-) CAD, (-) Past MI, (-) Cardiac Stents and (-) CABG (-) dysrhythmias (-) Valvular Problems/Murmurs     Neuro/Psych negative neurological ROS  negative psych ROS   GI/Hepatic negative GI ROS, Neg liver ROS,   Endo/Other  diabetes  Renal/GU negative Renal ROS  negative genitourinary   Musculoskeletal   Abdominal   Peds  Hematology negative hematology ROS (+)   Anesthesia Other Findings Past Medical History: No date: Arthritis 2009: Breast cancer ([place])     Comment:  Left- radiation No date: Breast screening, unspecified 2009: Cancer ([place])     Comment:  left breast wide excision with sn bx left axillary               dissection. The nodes were clinically positive but               negative on path review. She had 1.7 cm tumor that was ER              positive and not oveerly expressing for HER2/neu. Her               oncotype dx testing put her at low risk for metastatic               disease. No date: Diabetes mellitus without complication ([place]) 7035: Disc     Comment:  bulging discs 2005: Gout No date: Hypertension No date: Liver disease No date: Lump or mass in breast No date: Malignant neoplasm of upper-outer quadrant of female breast  ([place]) No date: Obesity, unspecified 2009: Personal history of malignant neoplasm  of breast     Comment:  left breast No date: Personal history of radiation therapy No date: Special screening for malignant neoplasms, colon   Reproductive/Obstetrics negative OB ROS                             Anesthesia Physical Anesthesia Plan  ASA: III  Anesthesia Plan: General   Post-op Pain Management:    Induction: Intravenous  PONV Risk Score and Plan: 3 and Ondansetron, Dexamethasone and Treatment may vary due to age or medical condition  Airway Management Planned: LMA  Additional Equipment:   Intra-op Plan:   Post-operative Plan: Extubation in OR  Informed Consent: I have reviewed the patients History and Physical, chart, labs and discussed the procedure including the risks, benefits and alternatives for the proposed anesthesia with the patient or authorized representative who has indicated his/her understanding and acceptance.   Dental Advisory Given  Plan Discussed with: Anesthesiologist, CRNA and Surgeon  Anesthesia Plan Comments:         Anesthesia Quick Evaluation

## 2018-02-12 NOTE — OR Nursing (Signed)
Per OR room nurse, ok to discharge to home per Dr. Bary Castilla without his visit to postop.

## 2018-02-12 NOTE — Anesthesia Procedure Notes (Signed)
Procedure Name: LMA Insertion Date/Time: 02/12/2018 7:36 AM Performed by: Eben Burow, CRNA Pre-anesthesia Checklist: Patient identified, Emergency Drugs available, Suction available, Patient being monitored and Timeout performed Patient Re-evaluated:Patient Re-evaluated prior to induction Oxygen Delivery Method: Circle system utilized Preoxygenation: Pre-oxygenation with 100% oxygen Induction Type: IV induction LMA: LMA inserted LMA Size: 4.0 Number of attempts: 1 Placement Confirmation: positive ETCO2 and breath sounds checked- equal and bilateral Tube secured with: Tape Dental Injury: Teeth and Oropharynx as per pre-operative assessment

## 2018-02-12 NOTE — Discharge Instructions (Addendum)
Breast Biopsy, Care After These instructions give you information about caring for yourself after your procedure. Your doctor may also give you more specific instructions. Call your doctor if you have any problems or questions after your procedure. Follow these instructions at home: Medicines  Take over-the-counter and prescription medicines only as told by your doctor.  Do not drive for 24 hours if you received a sedative.  Do not drink alcohol while taking pain medicine.  Do not drive or use heavy machinery while taking prescription pain medicine. Biopsy Site Care   Follow instructions from your doctor about how to take care of your cut from surgery (incision) or puncture area. Make sure you: ? Wash your hands with soap and water before you change your bandage. If you cannot use soap and water, use hand sanitizer. ? Change any bandages (dressings) as told by your doctor. ? Leave any stitches (sutures), skin glue, or skin tape (adhesive) strips in place. They may need to stay in place for 2 weeks or longer. If tape strips get loose and curl up, you may trim the loose edges. Do not remove tape strips completely unless your doctor says it is okay.  If you have stitches, keep them dry when you take a bath or a shower.  Check your cut or puncture area every day for signs of infection. Check for: ? More redness, swelling, or pain. ? More fluid or blood. ? Warmth. ? Pus or a bad smell.  Protect the biopsy area. Do not let the area get bumped. Activity  Avoid activities that could pull the biopsy site open. ? Avoid stretching. ? Avoid reaching. ? Avoid exercise. ? Avoid sports. ? Avoid lifting anything that is heavier than 3 pounds (1.4 kg).  Return to your normal activities as told by your doctor. Ask your doctor what activities are safe for you. General instructions  Continue your normal diet.  Wear a good support bra for as long as told by your doctor.  Get checked for  extra fluid in your body (lymphedema) as often as told by your doctor.  Keep all follow-up visits as told by your doctor. This is important. Contact a health care provider if:  You have more redness, swelling, or pain at the biopsy site.  You have more fluid or blood coming from your biopsy site.  Your biopsy site feels warm to the touch.  You have pus or a bad smell coming from the biopsy site.  Your biopsy site breaks open after the stitches, staples, or skin tape strips have been removed.  You have a rash.  You have a fever. Get help right away if:  You have more bleeding (more than a small spot) from the biopsy site.  You have trouble breathing.  You have red streaks around the biopsy site. This information is not intended to replace advice given to you by your health care provider. Make sure you discuss any questions you have with your health care provider. Document Released: 01/15/2009 Document Revised: 11/26/2015 Document Reviewed: 12/23/2014 Elsevier Interactive Patient Education  2018 [place]   1) The drugs that you were given will stay in your system until tomorrow so for the next 24 hours you should not:  A) Drive an automobile B) Make any legal decisions C) Drink any alcoholic beverage   2) You may resume regular meals tomorrow.  Today it is better to start with liquids and gradually work up to solid  foods.  You may eat anything you prefer, but it is better to start with liquids, then soup and crackers, and gradually work up to solid foods.   3) Please notify your doctor immediately if you have any unusual bleeding, trouble breathing, redness and pain at the surgery site, drainage, fever, or pain not relieved by medication.    4) Additional Instructions:        Please contact your physician with any problems or Same Day Surgery at [phone], Monday through Friday 6 am to 4 pm, or [place] at  [facility] number at [phone].

## 2018-02-12 NOTE — H&P (Signed)
No change in clinical condition or exam.  For excision right breast retroareolar ductal tissue.

## 2018-02-13 NOTE — Anesthesia Postprocedure Evaluation (Signed)
Anesthesia Post Note  Patient: Zyionna G Pagel  Procedure(s) Performed: BREAST BIOPSY (Right )  Patient location during evaluation: PACU Anesthesia Type: General Level of consciousness: awake and alert Pain management: pain level controlled Vital Signs Assessment: post-procedure vital signs reviewed and stable Respiratory status: spontaneous breathing, nonlabored ventilation, respiratory function stable and patient connected to nasal cannula oxygen Cardiovascular status: blood pressure returned to baseline and stable Postop Assessment: no apparent nausea or vomiting Anesthetic complications: no     Last Vitals:  Vitals:   02/12/18 0857 02/12/18 0905  BP: (!) 151/83 (!) 156/76  Pulse: 93 90  Resp: 13 16  Temp: 36.7 C 36.6 C  SpO2: 95% 94%    Last Pain:  Vitals:   02/13/18 0838  TempSrc:   PainSc: 2                  Martha Clan

## 2018-02-14 ENCOUNTER — Telehealth: Payer: Self-pay | Admitting: *Deleted

## 2018-02-14 LAB — SURGICAL PATHOLOGY

## 2018-02-14 NOTE — Telephone Encounter (Signed)
Notified patient as instructed, patient pleased. Discussed follow-up appointments, patient agrees  

## 2018-02-14 NOTE — Telephone Encounter (Signed)
-----   Message from Robert Bellow, MD sent at 02/14/2018 12:35 PM EST ----- Please notify the patient the pathology was fine. No cancer. OLD milk ducts.  ----- Message ----- From: Interface, Lab In Three Zero One Sent: 02/14/2018   9:08 AM EST To: Robert Bellow, MD

## 2018-02-19 ENCOUNTER — Other Ambulatory Visit: Payer: Self-pay

## 2018-02-22 ENCOUNTER — Other Ambulatory Visit: Payer: Self-pay

## 2018-02-22 ENCOUNTER — Ambulatory Visit (INDEPENDENT_AMBULATORY_CARE_PROVIDER_SITE_OTHER): Payer: Medicare Other | Admitting: General Surgery

## 2018-02-22 ENCOUNTER — Encounter: Payer: Self-pay | Admitting: General Surgery

## 2018-02-22 VITALS — BP 132/58 | HR 118 | Resp 20 | Ht 65.0 in | Wt 167.0 lb

## 2018-02-22 DIAGNOSIS — N6459 Other signs and symptoms in breast: Secondary | ICD-10-CM

## 2018-02-22 NOTE — Progress Notes (Signed)
Patient ID: Marisa Marsh, female   DOB: 12-27-1945, 72 y.o.   MRN: [id number]  Chief Complaint  Patient presents with  . Routine Post Op    right breast biopsy    HPI Marisa Marsh is a 72 y.o. female. Here for postoperative visit, right breast biopsy on 02-12-18. She states she is doing well. She states the area is still sore "burning" and tender to touch.  HPI  Past Medical History:  Diagnosis Date  . Arthritis   . Breast cancer ([place]) 2009   Left- radiation  . Breast mass, right 02/12/2018   BENIGN MAMMARY PARENCHYMA SHOWING DENSE PERIDUCTAL FIBROSIS   . Breast screening, unspecified   . Cancer [facility]) 2009   left breast wide excision with sn bx left axillary dissection. The nodes were clinically positive but negative on path review. She had 1.7 cm tumor that was ER positive and not oveerly expressing for HER2/neu. Her oncotype dx testing put her at low risk for metastatic disease.  . Diabetes mellitus without complication ([place])   . Disc 2009   bulging discs  . Gout 2005  . Hypertension   . Liver disease   . Lump or mass in breast   . Malignant neoplasm of upper-outer quadrant of female breast ([place])   . Obesity, unspecified   . Personal history of malignant neoplasm of breast 2009   left breast  . Personal history of radiation therapy   . Special screening for malignant neoplasms, colon     Past Surgical History:  Procedure Laterality Date  . BACK SURGERY    . BREAST BIOPSY Left 2009   positive  . BREAST BIOPSY Left 2012   neg  . BREAST BIOPSY Right 02/12/2018   Procedure: BREAST BIOPSY;  Surgeon: Robert Bellow, MD;  Location: [facility] ORS;  Service: General;  Laterality: Right;  . BREAST LUMPECTOMY Left 2009   w/ radiation  . BREAST SURGERY Left 01/08/2008   left breast lumpectomy with sn biopsy  . CARPAL TUNNEL RELEASE Right Dec 2014  . COLONOSCOPY  2013, 2015   Dr. Bary Castilla, 2.5 cm sessile polyp in the cecum, pathology showed a tubular adenoma without  dysplasia.  . COLONOSCOPY WITH PROPOFOL N/A 05/03/2017   Procedure: COLONOSCOPY WITH PROPOFOL;  Surgeon: Robert Bellow, MD;  Location: [facility] ENDOSCOPY;  Service: Endoscopy;  Laterality: N/A;  . TUBAL LIGATION      Family History  Problem Relation Age of Onset  . Breast cancer Neg Hx     Social History Social History   Tobacco Use  . Smoking status: Former Smoker    Last attempt to quit: 02/07/1978    Years since quitting: 40.0  . Smokeless tobacco: Former Systems developer    Quit date: 04/05/1971  Substance Use Topics  . Alcohol use: No  . Drug use: No    Allergies  Allergen Reactions  . Simvastatin Other (See Comments)    MUSCLE PAIN    Current Outpatient Medications  Medication Sig Dispense Refill  . allopurinol (ZYLOPRIM) 300 MG tablet Take 300 mg by mouth daily.    [name] aspirin 81 MG tablet Take 81 mg by mouth at bedtime.     . busPIRone (BUSPAR) 15 MG tablet Take 15 mg by mouth 3 (three) times daily.     . Cholecalciferol (VITAMIN D3) 1000 UNITS CAPS Take 1,000 Units by mouth 2 (two) times daily.     . diphenhydramine-acetaminophen (TYLENOL PM) 25-500 MG TABS tablet Take 2 tablets by mouth at  bedtime as needed (for sleep).    [name] glimepiride (AMARYL) 4 MG tablet Take 4 mg by mouth 2 (two) times daily.    [name] HYDROcodone-acetaminophen (NORCO) 7.5-325 MG tablet Take 1 tablet by mouth 2 (two) times daily as needed for moderate pain.    . indomethacin (INDOCIN) 50 MG capsule Take 50 mg by mouth daily as needed for moderate pain.    [name] levETIRAcetam (KEPPRA) 500 MG tablet Take 500 mg by mouth 3 (three) times daily.    [name] lisinopril-hydrochlorothiazide (PRINZIDE,ZESTORETIC) 20-12.5 MG per tablet Take 1 tablet by mouth daily.    . metFORMIN (GLUCOPHAGE) 1000 MG tablet Take 1,000 mg by mouth 2 (two) times daily with a meal.     . nortriptyline (PAMELOR) 25 MG capsule Take 50 mg by mouth at bedtime.     . pravastatin (PRAVACHOL) 80 MG tablet Take 80 mg by mouth at bedtime.     . vitamin B-12  (CYANOCOBALAMIN) 500 MCG tablet Take 500 mcg by mouth daily.     No current facility-administered medications for this visit.     Review of Systems Review of Systems  Constitutional: Negative.   Respiratory: Negative.   Cardiovascular: Negative.     Blood pressure (!) 132/58, pulse (!) 118, resp. rate 20, height _0  (1.651 m), weight 167 lb (75.8 kg), SpO2 98 %.  Physical Exam Physical Exam  Constitutional: She is oriented to person, place, and time. She appears well-developed and well-nourished.  Pulmonary/Chest:  Right breast biopsy site with bruising, steri strips intact     Neurological: She is alert and oriented to person, place, and time.  Skin: Skin is warm and dry.  Psychiatric: Her behavior is normal.    Data Reviewed February 12, 2018 biopsy:  DIAGNOSIS:  A. BREAST, RIGHT RETROAREOLAR DUCT; EXCISION:  - BENIGN MAMMARY PARENCHYMA SHOWING DENSE PERIDUCTAL FIBROSIS AND DUCT  ECTASIA WITH ASSOCIATED LYMPHOCYTIC INFILTRATE.  - CALCIFICATIONS IDENTIFIED WITHIN DUCT LUMEN AND PERIDUCTAL FIBROSIS.  - FOCAL USUAL DUCTAL HYPERPLASIA.  - NO EVIDENCE OF ATYPICAL PROLIFERATIVE BREAST DISEASE.   Assessment    Dong well post ductal structure     Plan    The patient is aware to use a heating pad as needed for comfort. The patient is aware to call back for any questions or new concerns. Follow up in Jan as scheduled. Vaseline to nipple area to keep soft.     HPI, Physical Exam, Assessment and Plan have been scribed under the direction and in the presence of Robert Bellow, MD. Karie Fetch, RN  I have completed the exam and reviewed the above documentation for accuracy and completeness.  I agree with the above.  Haematologist has been used and any errors in dictation or transcription are unintentional.  Hervey Ard, M.D., F.A.C.S.  Forest Gleason Dama Hedgepeth 02/22/2018, 9:15 AM

## 2018-02-22 NOTE — Patient Instructions (Addendum)
The patient is aware to call back for any questions or new concerns.  Follow up in Jan as scheduled.

## 2018-02-27 ENCOUNTER — Other Ambulatory Visit: Payer: Self-pay

## 2018-02-27 DIAGNOSIS — Z1231 Encounter for screening mammogram for malignant neoplasm of breast: Secondary | ICD-10-CM

## 2018-04-17 ENCOUNTER — Ambulatory Visit: Payer: Medicare Other | Admitting: General Surgery

## 2018-05-10 ENCOUNTER — Ambulatory Visit: Payer: Medicare Other | Admitting: General Surgery

## 2018-07-19 ENCOUNTER — Ambulatory Visit: Payer: Medicare Other | Admitting: General Surgery

## 2018-08-14 ENCOUNTER — Ambulatory Visit: Payer: Medicare Other | Admitting: General Surgery

## 2018-08-28 ENCOUNTER — Other Ambulatory Visit: Payer: Self-pay

## 2018-08-28 ENCOUNTER — Ambulatory Visit: Payer: Medicare Other | Admitting: General Surgery

## 2018-08-28 ENCOUNTER — Encounter: Payer: Self-pay | Admitting: General Surgery

## 2018-08-28 VITALS — BP 158/83 | HR 120 | Temp 95.7°F | Resp 16 | Ht 65.0 in | Wt 166.6 lb

## 2018-08-28 DIAGNOSIS — Z853 Personal history of malignant neoplasm of breast: Secondary | ICD-10-CM

## 2018-08-28 NOTE — Patient Instructions (Addendum)
Return in 6 months screening  mammogram.The patient is aware to call back for any questions or concerns.

## 2018-08-28 NOTE — Progress Notes (Signed)
Patient ID: Marisa Marsh, female   DOB: 1946-03-01, 73 y.o.   MRN: [id number]  Chief Complaint  Patient presents with  . Routine Post Op    Right breast duct excision    HPI Marisa Marsh is a 73 y.o. female.  Here for follow up Right breast lumpectomy. No complaints.  HPI  Past Medical History:  Diagnosis Date  . Arthritis   . Breast cancer ([place]) 2009   Left- radiation  . Breast mass, right 02/12/2018   BENIGN MAMMARY PARENCHYMA SHOWING DENSE PERIDUCTAL FIBROSIS   . Breast screening, unspecified   . Cancer [facility]) 2009   left breast wide excision with sn bx left axillary dissection. The nodes were clinically positive but negative on path review. She had 1.7 cm tumor that was ER positive and not oveerly expressing for HER2/neu. Her oncotype dx testing put her at low risk for metastatic disease.  . Diabetes mellitus without complication ([place])   . Disc 2009   bulging discs  . Gout 2005  . Hypertension   . Liver disease   . Lump or mass in breast   . Malignant neoplasm of upper-outer quadrant of female breast ([place])   . Obesity, unspecified   . Personal history of malignant neoplasm of breast 2009   left breast  . Personal history of radiation therapy   . Special screening for malignant neoplasms, colon     Past Surgical History:  Procedure Laterality Date  . BACK SURGERY    . BREAST BIOPSY Left 2009   positive  . BREAST BIOPSY Left 2012   neg  . BREAST BIOPSY Right 02/12/2018   Procedure: BREAST BIOPSY;  Surgeon: Robert Bellow, MD;  Location: [facility] ORS;  Service: General;  Laterality: Right;  . BREAST LUMPECTOMY Left 2009   w/ radiation  . BREAST SURGERY Left 01/08/2008   left breast lumpectomy with sn biopsy  . CARPAL TUNNEL RELEASE Right Dec 2014  . COLONOSCOPY  2013, 2015   Dr. Bary Castilla, 2.5 cm sessile polyp in the cecum, pathology showed a tubular adenoma without dysplasia.  . COLONOSCOPY WITH PROPOFOL N/A 05/03/2017   Procedure: COLONOSCOPY WITH PROPOFOL;   Surgeon: Robert Bellow, MD;  Location: [facility] ENDOSCOPY;  Service: Endoscopy;  Laterality: N/A;  . TUBAL LIGATION      Family History  Problem Relation Age of Onset  . Breast cancer Neg Hx     Social History Social History   Tobacco Use  . Smoking status: Former Smoker    Last attempt to quit: 02/07/1978    Years since quitting: 40.5  . Smokeless tobacco: Former Systems developer    Quit date: 04/05/1971  Substance Use Topics  . Alcohol use: No  . Drug use: No    Allergies  Allergen Reactions  . Simvastatin Other (See Comments)    MUSCLE PAIN    Current Outpatient Medications  Medication Sig Dispense Refill  . allopurinol (ZYLOPRIM) 300 MG tablet Take 300 mg by mouth daily.    [name] aspirin 81 MG tablet Take 81 mg by mouth at bedtime.     . busPIRone (BUSPAR) 15 MG tablet Take 15 mg by mouth 3 (three) times daily.     . Cholecalciferol (VITAMIN D3) 1000 UNITS CAPS Take 1,000 Units by mouth 2 (two) times daily.     . diphenhydramine-acetaminophen (TYLENOL PM) 25-500 MG TABS tablet Take 2 tablets by mouth at bedtime as needed (for sleep).    [name] glimepiride (AMARYL) 4 MG tablet Take 4  mg by mouth 2 (two) times daily.    [name] HYDROcodone-acetaminophen (NORCO) 7.5-325 MG tablet Take 1 tablet by mouth 2 (two) times daily as needed for moderate pain.    . indomethacin (INDOCIN) 50 MG capsule Take 50 mg by mouth daily as needed for moderate pain.    [name] levETIRAcetam (KEPPRA) 500 MG tablet Take 500 mg by mouth 3 (three) times daily.    [name] lisinopril-hydrochlorothiazide (PRINZIDE,ZESTORETIC) 20-12.5 MG per tablet Take 1 tablet by mouth daily.    . metFORMIN (GLUCOPHAGE) 1000 MG tablet Take 1,000 mg by mouth 2 (two) times daily with a meal.     . nortriptyline (PAMELOR) 25 MG capsule Take 50 mg by mouth at bedtime.     . pravastatin (PRAVACHOL) 80 MG tablet Take 80 mg by mouth at bedtime.     . vitamin B-12 (CYANOCOBALAMIN) 500 MCG tablet Take 500 mcg by mouth daily.     No current facility-administered  medications for this visit.     Review of Systems Review of Systems  Constitutional: Negative.   Respiratory: Negative.   Cardiovascular: Negative.     Blood pressure (!) 158/83, pulse (!) 120, temperature (!) 95.7 F (35.4 C), temperature source Temporal, resp. rate 16, height '5\' 5"'$  (1.651 m), weight 166 lb 9.6 oz (75.6 kg), SpO2 96 %.  Physical Exam Physical Exam Exam conducted with a chaperone present.  Constitutional:      Appearance: She is well-developed.  Eyes:     General: No scleral icterus.    Conjunctiva/sclera: Conjunctivae normal.  Neck:     Musculoskeletal: Normal range of motion.  Cardiovascular:     Rate and Rhythm: Normal rate and regular rhythm.     Heart sounds: Normal heart sounds.  Pulmonary:     Effort: Pulmonary effort is normal.     Breath sounds: Normal breath sounds.  Chest:     Breasts:        Right: No inverted nipple, mass, nipple discharge, skin change or tenderness.        Left: No inverted nipple, mass, nipple discharge, skin change or tenderness.    Lymphadenopathy:     Cervical: No cervical adenopathy.     Upper Body:     Right upper body: No supraclavicular or axillary adenopathy.     Left upper body: No supraclavicular or axillary adenopathy.  Skin:    General: Skin is warm and dry.  Neurological:     Mental Status: She is alert and oriented to person, place, and time.     Data Reviewed  February 12, 2018 right breast pathology: DIAGNOSIS:  A. BREAST, RIGHT RETROAREOLAR DUCT; EXCISION:  - BENIGN MAMMARY PARENCHYMA SHOWING DENSE PERIDUCTAL FIBROSIS AND DUCT  ECTASIA WITH ASSOCIATED LYMPHOCYTIC INFILTRATE.  - CALCIFICATIONS IDENTIFIED WITHIN DUCT LUMEN AND PERIDUCTAL FIBROSIS.  - FOCAL USUAL DUCTAL HYPERPLASIA.  - NO EVIDENCE OF ATYPICAL PROLIFERATIVE BREAST DISEASE.   Assessment Doing well post excision right retroareolar ductal structures.  Plan  Return in 6 months screening mammogram.The patient is aware to call back  for any questions or concerns.   HPI, Physical Exam, Assessment and Plan have been scribed under the direction and in the presence of Robert Bellow, MD. Jonnie Finner, CMA  I have completed the exam and reviewed the above documentation for accuracy and completeness.  I agree with the above.  Haematologist has been used and any errors in dictation or transcription are unintentional.  Hervey Ard, M.D., F.A.C.S.  Forest Gleason Abdulah Iqbal 08/30/2018,  4:21 PM

## 2018-10-24 ENCOUNTER — Encounter: Payer: Self-pay | Admitting: General Surgery

## 2018-12-17 ENCOUNTER — Other Ambulatory Visit: Payer: Self-pay

## 2018-12-17 DIAGNOSIS — Z1231 Encounter for screening mammogram for malignant neoplasm of breast: Secondary | ICD-10-CM

## 2019-02-04 ENCOUNTER — Telehealth: Payer: Self-pay

## 2019-02-04 NOTE — Telephone Encounter (Signed)
Patient called requesting that her appointment with Dr.Pabon on 02/20/19 be rescheduled to some time after the week of the 23rd. Patient is scheduled for 02/27/19 at 11:00am. Patient verbalizes understanding.

## 2019-02-11 ENCOUNTER — Ambulatory Visit
Admission: RE | Admit: 2019-02-11 | Discharge: 2019-02-11 | Disposition: A | Discharge status: Home / Self Care | Payer: Medicare Other | Source: Ambulatory Visit | Attending: Surgery | Admitting: Surgery

## 2019-02-11 DIAGNOSIS — Z1231 Encounter for screening mammogram for malignant neoplasm of breast: Secondary | ICD-10-CM | POA: Insufficient documentation

## 2019-02-20 ENCOUNTER — Ambulatory Visit: Payer: Medicare Other | Admitting: Surgery

## 2019-02-27 ENCOUNTER — Ambulatory Visit: Payer: Self-pay | Admitting: Surgery

## 2019-09-18 ENCOUNTER — Emergency Department: Payer: Medicare Other

## 2019-09-18 ENCOUNTER — Emergency Department
Admission: EM | Admit: 2019-09-18 | Discharge: 2019-09-18 | Disposition: A | Discharge status: Home / Self Care | Payer: Medicare Other | Attending: Emergency Medicine | Admitting: Emergency Medicine

## 2019-09-18 ENCOUNTER — Other Ambulatory Visit: Payer: Self-pay

## 2019-09-18 ENCOUNTER — Encounter: Payer: Self-pay | Admitting: Emergency Medicine

## 2019-09-18 DIAGNOSIS — R1013 Epigastric pain: Secondary | ICD-10-CM | POA: Insufficient documentation

## 2019-09-18 DIAGNOSIS — I129 Hypertensive chronic kidney disease with stage 1 through stage 4 chronic kidney disease, or unspecified chronic kidney disease: Secondary | ICD-10-CM | POA: Insufficient documentation

## 2019-09-18 DIAGNOSIS — J90 Pleural effusion, not elsewhere classified: Secondary | ICD-10-CM | POA: Diagnosis not present

## 2019-09-18 DIAGNOSIS — R609 Edema, unspecified: Secondary | ICD-10-CM | POA: Insufficient documentation

## 2019-09-18 DIAGNOSIS — Z7982 Long term (current) use of aspirin: Secondary | ICD-10-CM | POA: Diagnosis not present

## 2019-09-18 DIAGNOSIS — R0602 Shortness of breath: Secondary | ICD-10-CM | POA: Diagnosis present

## 2019-09-18 DIAGNOSIS — Z79899 Other long term (current) drug therapy: Secondary | ICD-10-CM | POA: Diagnosis not present

## 2019-09-18 DIAGNOSIS — Z7984 Long term (current) use of oral hypoglycemic drugs: Secondary | ICD-10-CM | POA: Insufficient documentation

## 2019-09-18 DIAGNOSIS — E1122 Type 2 diabetes mellitus with diabetic chronic kidney disease: Secondary | ICD-10-CM | POA: Diagnosis not present

## 2019-09-18 DIAGNOSIS — Z87891 Personal history of nicotine dependence: Secondary | ICD-10-CM | POA: Insufficient documentation

## 2019-09-18 DIAGNOSIS — R601 Generalized edema: Secondary | ICD-10-CM

## 2019-09-18 DIAGNOSIS — N182 Chronic kidney disease, stage 2 (mild): Secondary | ICD-10-CM | POA: Insufficient documentation

## 2019-09-18 LAB — COMPREHENSIVE METABOLIC PANEL
ALT: 14 U/L (ref 0–44)
AST: 22 U/L (ref 15–41)
Albumin: 3.6 g/dL (ref 3.5–5.0)
Alkaline Phosphatase: 26 U/L — ABNORMAL LOW (ref 38–126)
Anion gap: 13 (ref 5–15)
BUN: 25 mg/dL — ABNORMAL HIGH (ref 8–23)
CO2: 23 mmol/L (ref 22–32)
Calcium: 9.6 mg/dL (ref 8.9–10.3)
Chloride: 106 mmol/L (ref 98–111)
Creatinine, Ser: 1.47 mg/dL — ABNORMAL HIGH (ref 0.44–1.00)
GFR calc Af Amer: 41 mL/min — ABNORMAL LOW (ref >60)
GFR calc non Af Amer: 35 mL/min — ABNORMAL LOW (ref >60)
Glucose, Bld: 224 mg/dL — ABNORMAL HIGH (ref 70–99)
Potassium: 3.8 mmol/L (ref 3.5–5.1)
Sodium: 142 mmol/L (ref 135–145)
Total Bilirubin: 0.6 mg/dL (ref 0.3–1.2)
Total Protein: 7.7 g/dL (ref 6.5–8.1)

## 2019-09-18 LAB — CBC WITH DIFFERENTIAL/PLATELET
Abs Immature Granulocytes: 0.04 10*3/uL (ref 0.00–0.07)
Basophils Absolute: 0.1 10*3/uL (ref 0.0–0.1)
Basophils Relative: 1 %
Eosinophils Absolute: 0.1 10*3/uL (ref 0.0–0.5)
Eosinophils Relative: 1 %
HCT: 33.3 % — ABNORMAL LOW (ref 36.0–46.0)
Hemoglobin: 11 g/dL — ABNORMAL LOW (ref 12.0–15.0)
Immature Granulocytes: 0 %
Lymphocytes Relative: 20 %
Lymphs Abs: 2.3 10*3/uL (ref 0.7–4.0)
MCH: 28.7 pg (ref 26.0–34.0)
MCHC: 33 g/dL (ref 30.0–36.0)
MCV: 86.9 fL (ref 80.0–100.0)
Monocytes Absolute: 0.7 10*3/uL (ref 0.1–1.0)
Monocytes Relative: 6 %
Neutro Abs: 8.1 10*3/uL — ABNORMAL HIGH (ref 1.7–7.7)
Neutrophils Relative %: 72 %
Platelets: 248 10*3/uL (ref 150–400)
RBC: 3.83 MIL/uL — ABNORMAL LOW (ref 3.87–5.11)
RDW: 15.6 % — ABNORMAL HIGH (ref 11.5–15.5)
WBC: 11.3 10*3/uL — ABNORMAL HIGH (ref 4.0–10.5)
nRBC: 0 % (ref 0.0–0.2)

## 2019-09-18 LAB — URINALYSIS, COMPLETE (UACMP) WITH MICROSCOPIC
Bacteria, UA: NONE SEEN
Bilirubin Urine: NEGATIVE
Glucose, UA: 50 mg/dL — AB
Hgb urine dipstick: NEGATIVE
Ketones, ur: 5 mg/dL — AB
Leukocytes,Ua: NEGATIVE
Nitrite: NEGATIVE
Protein, ur: 30 mg/dL — AB
Specific Gravity, Urine: 1.013 (ref 1.005–1.030)
pH: 6 (ref 5.0–8.0)

## 2019-09-18 LAB — LIPASE, BLOOD: Lipase: 37 U/L (ref 11–51)

## 2019-09-18 LAB — TROPONIN I (HIGH SENSITIVITY): Troponin I (High Sensitivity): 6 ng/L (ref <18)

## 2019-09-18 LAB — BRAIN NATRIURETIC PEPTIDE: B Natriuretic Peptide: 130.8 pg/mL — ABNORMAL HIGH (ref 0.0–100.0)

## 2019-09-18 MED ORDER — FUROSEMIDE 20 MG PO TABS: 20.0000 mg | ORAL_TABLET | Freq: Every day | ORAL | 0 refills | Status: DC

## 2019-09-18 MED ORDER — HYDROCODONE-ACETAMINOPHEN 5-325 MG PO TABS
1.0000 | ORAL_TABLET | Freq: Once | ORAL | Status: AC
  Administered 2019-09-18: 1 via ORAL
  Filled 2019-09-18: qty 1

## 2019-09-18 MED ORDER — FUROSEMIDE 40 MG PO TABS
20.0000 mg | ORAL_TABLET | ORAL | Status: AC
  Administered 2019-09-18: 20 mg via ORAL
  Filled 2019-09-18: qty 1

## 2019-09-18 NOTE — Discharge Instructions (Addendum)
Follow-up with cardiology tomorrow at 11 AM   Return to the Emergency Department (ED) if you experience any further chest pain/pressure/tightness, difficulty breathing, or sudden sweating, or other symptoms that concern you.

## 2019-09-18 NOTE — ED Notes (Signed)
Pt to CT

## 2019-09-18 NOTE — ED Triage Notes (Signed)
Pt to triage via w/c with no distress noted, mask in place; pt reports nausea, SHOB and upper abd pain for last several days

## 2019-09-18 NOTE — ED Provider Notes (Signed)
[facility] Emergency Department Provider Note   ____________________________________________   First MD Initiated Contact with Patient 09/18/19 [phone]     (approximate)  I have reviewed the triage vital signs and the nursing notes.   HISTORY  Chief Complaint Nausea and Abdominal Pain    HPI Marisa Marsh is a 74 y.o. female here for evaluation of feeling short of breath especially when she lays down  Patient reports she has noticed over the last week that she has felt a little swollen from about her knees down on each side of her legs.  Then she noticed yesterday when she was trying to lay down at night she had shortness of breath.  She also noticed a little bit of a sensation of discomfort which she describes very mild in the middle of her chest a little more to the right side at times  No radiating pain.  A little bit of nausea no vomiting.  Denies abdominal pain except she has been experiencing several months of discomfort just below her breastbone, reports that she was evaluated at [facility] and they found it may be related to hardening of a portion of her liver due to use of previous indomethacin  No fevers or chills.  No cough.  Denies chest pain just reports little bit of discomfort more on the right side  She has a distant history of breast cancer.  No major abdominal surgeries.  Still eating and drinking normally.  Compliant with her medications   Past Medical History:  Diagnosis Date  . Arthritis   . Breast cancer ([place]) 2009   Left- radiation  . Breast mass, right 02/12/2018   BENIGN MAMMARY PARENCHYMA SHOWING DENSE PERIDUCTAL FIBROSIS   . Breast screening, unspecified   . Cancer [facility]) 2009   left breast wide excision with sn bx left axillary dissection. The nodes were clinically positive but negative on path review. She had 1.7 cm tumor that was ER positive and not oveerly expressing for HER2/neu. Her oncotype dx testing put her at low risk  for metastatic disease.  . Diabetes mellitus without complication ([place])   . Disc 2009   bulging discs  . Gout 2005  . Hypertension   . Liver disease   . Lump or mass in breast   . Malignant neoplasm of upper-outer quadrant of female breast ([place])   . Obesity, unspecified   . Personal history of malignant neoplasm of breast 2009   left breast  . Personal history of radiation therapy   . Special screening for malignant neoplasms, colon     Patient Active Problem List   Diagnosis Date Noted  . Bleeding from nipple in female 01/16/2018  . 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 Pain medication agreement 08/13/2013  . Hypertension 08/07/2013  . Type 2 diabetes mellitus with stage 2 chronic kidney disease, without long-term current use of insulin ([place]) 08/07/2013  . Neuropathic pain of both legs 06/18/2013  . Carpal tunnel syndrome 04/01/2013  . Spinal stenosis in cervical region 11/23/2012  . Breast cancer ([place]) 11/13/2012  . Personal history of malignant neoplasm of breast 09/11/2012  . Personal history of colonic polyps 09/11/2012  . Cancer ([place])   . Gout 02/05/2010  . Hypercholesterolemia 02/18/2009    Past Surgical History:  Procedure Laterality Date  . BACK SURGERY    . BREAST BIOPSY Left 2009   IDC  . BREAST BIOPSY Left 2012   neg  . BREAST BIOPSY Right 02/12/2018  Procedure: BREAST BIOPSY;  Surgeon: Robert Bellow, MD;  Location: [facility] ORS;  Service: General;  Laterality: Right;  . BREAST LUMPECTOMY Left 2009   w/ radiation  . BREAST SURGERY Left 01/08/2008   left breast lumpectomy with sn biopsy  . CARPAL TUNNEL RELEASE Right Dec 2014  . COLONOSCOPY  2013, 2015   Dr. Bary Castilla, 2.5 cm sessile polyp in the cecum, pathology showed a tubular adenoma without dysplasia.  . COLONOSCOPY WITH PROPOFOL N/A 05/03/2017   Procedure: COLONOSCOPY WITH PROPOFOL;  Surgeon: Robert Bellow, MD;  Location: [facility] ENDOSCOPY;  Service: Endoscopy;  Laterality: N/A;  . TUBAL  LIGATION      Prior to Admission medications   Medication Sig Start Date End Date Taking? Authorizing Provider  allopurinol (ZYLOPRIM) 300 MG tablet Take 300 mg by mouth daily.    [provider]  aspirin 81 MG tablet Take 81 mg by mouth at bedtime.     [provider]  Cholecalciferol (VITAMIN D3) 1000 UNITS CAPS Take 1,000 Units by mouth 2 (two) times daily.     [provider]  diphenhydramine-acetaminophen (TYLENOL PM) 25-500 MG TABS tablet Take 2 tablets by mouth at bedtime as needed (for sleep).    [provider]  furosemide (LASIX) 20 MG tablet Take 1 tablet (20 mg total) by mouth daily for 7 days. 09/18/19 09/25/19  Delman Kitten, MD  glimepiride (AMARYL) 4 MG tablet Take 4 mg by mouth 2 (two) times daily.    [provider]  HYDROcodone-acetaminophen (NORCO) 7.5-325 MG tablet Take 1 tablet by mouth 2 (two) times daily as needed for moderate pain.    [provider]  indomethacin (INDOCIN) 50 MG capsule Take 50 mg by mouth daily as needed for moderate pain.    [provider]  levETIRAcetam (KEPPRA) 500 MG tablet Take 500 mg by mouth 3 (three) times daily.    [provider]  lisinopril-hydrochlorothiazide (PRINZIDE,ZESTORETIC) 20-12.5 MG per tablet Take 1 tablet by mouth daily.    [provider]  metFORMIN (GLUCOPHAGE) 1000 MG tablet Take 1,000 mg by mouth 2 (two) times daily with a meal.     [provider]  nortriptyline (PAMELOR) 25 MG capsule Take 50 mg by mouth at bedtime.     [provider]  pravastatin (PRAVACHOL) 80 MG tablet Take 80 mg by mouth at bedtime.     [provider]  vitamin B-12 (CYANOCOBALAMIN) 500 MCG tablet Take 500 mcg by mouth daily.    [provider]    Allergies Simvastatin  Family History  Problem Relation Age of Onset  . Breast cancer Daughter 54    Social History Social History   Tobacco Use  . Smoking status: Former Smoker     Quit date: 02/07/1978    Years since quitting: 41.6  . Smokeless tobacco: Former Systems developer    Quit date: 04/05/1971  Vaping Use  . Vaping Use: Never used  Substance Use Topics  . Alcohol use: No  . Drug use: No    Review of Systems Constitutional: No fever/chills Eyes: No visual changes. ENT: No sore throat. Cardiovascular: Denies chest pain.  See HPI Respiratory: Denies shortness of breath now, but if she lays flat she will start to feel short of breath.  Slept last night in a recliner was not short of breath then. Gastrointestinal: No abdominal pain except as noted HPI but reports is chronic.   Genitourinary: Negative for dysuria. Musculoskeletal: Negative for back pain.  Pain in  her left hip joint, reports not here for that also a chronic issue Skin: Negative for rash. Neurological: Negative for headaches, areas of focal weakness or numbness.    ____________________________________________   PHYSICAL EXAM:  VITAL SIGNS: ED Triage Vitals  Enc Vitals Group     BP 09/18/19 0559 (!) 166/82     Pulse Rate 09/18/19 0559 98     Resp 09/18/19 0559 18     Temp 09/18/19 0559 98.6 F (37 C)     Temp Source 09/18/19 0559 Oral     SpO2 09/18/19 0559 97 %     Weight 09/18/19 0558 161 lb (73 kg)     Height 09/18/19 0558 '5\' 4"'$  (1.626 m)     Head Circumference --      Peak Flow --      Pain Score 09/18/19 0557 10     Pain Loc --      Pain Edu? --      Excl. in [place]? --     Constitutional: Alert and oriented. Well appearing and in no acute distress. Eyes: Conjunctivae are normal. Head: Atraumatic. Nose: No congestion/rhinnorhea. Mouth/Throat: Mucous membranes are moist. Neck: No stridor.  Cardiovascular: Normal rate, regular rhythm. Grossly normal heart sounds.  Good peripheral circulation. Respiratory: Normal respiratory effort.  No retractions. Lungs CTAB.  Sitting up, no distress speaking speaking full clear sentences normal oxygen saturation on room air. Gastrointestinal: Soft  and nontender. No distention.  Negative Murphy.  Mild discomfort in the epigastrium without rebound or guarding.  Patient again reports this is chronic in nature. Musculoskeletal: No lower extremity tenderness some with very mild trace edema in the ankles and feet bilateral. Neurologic:  Normal speech and language. No gross focal neurologic deficits are appreciated.  Skin:  Skin is warm, dry and intact. No rash noted. Psychiatric: Mood and affect are normal. Speech and behavior are normal.  ____________________________________________   LABS (all labs ordered are listed, but only abnormal results are displayed)  Labs Reviewed  CBC WITH DIFFERENTIAL/PLATELET - Abnormal; Notable for the following components:      Result Value   WBC 11.3 (*)    RBC 3.83 (*)    Hemoglobin 11.0 (*)    HCT 33.3 (*)    RDW 15.6 (*)    Neutro Abs 8.1 (*)    All other components within normal limits  COMPREHENSIVE METABOLIC PANEL - Abnormal; Notable for the following components:   Glucose, Bld 224 (*)    BUN 25 (*)    Creatinine, Ser 1.47 (*)    Alkaline Phosphatase 26 (*)    GFR calc non Af Amer 35 (*)    GFR calc Af Amer 41 (*)    All other components within normal limits  URINALYSIS, COMPLETE (UACMP) WITH MICROSCOPIC - Abnormal; Notable for the following components:   Color, Urine STRAW (*)    APPearance CLEAR (*)    Glucose, UA 50 (*)    Ketones, ur 5 (*)    Protein, ur 30 (*)    All other components within normal limits  BRAIN NATRIURETIC PEPTIDE - Abnormal; Notable for the following components:   B Natriuretic Peptide 130.8 (*)    All other components within normal limits  LIPASE, BLOOD  TROPONIN I (HIGH SENSITIVITY)   ____________________________________________  EKG  Reviewed interpreted at 6059 heart rate 99 QRS 70 QTc 440 Normal sinus rhythm, no evidence of acute ischemia.  Possible old anterior infarct ____________________________________________  RADIOLOGY  CT ABDOMEN WO  CONTRAST  Result Date: 09/18/2019 CLINICAL DATA:  Chest pain, epigastric abdominal pain. EXAM: CT CHEST AND ABDOMEN WITHOUT CONTRAST TECHNIQUE: Multidetector CT imaging of the chest and abdomen was performed following the standard protocol without intravenous contrast. COMPARISON:  May 21, 2010. FINDINGS: CT CHEST FINDINGS WITHOUT CONTRAST Cardiovascular: Atherosclerosis of thoracic aorta is noted without aneurysm formation. Normal cardiac size. No pericardial effusion. Mediastinum/Nodes: No enlarged mediastinal or axillary lymph nodes. Thyroid gland, trachea, and esophagus demonstrate no significant findings. Lungs/Pleura: No pneumothorax is noted. Minimal bilateral pleural effusions are noted with adjacent subsegmental atelectasis. Musculoskeletal: No chest wall mass or suspicious bone lesions identified. CT ABDOMEN FINDINGS WITHOUT CONTRAST Hepatobiliary: No cholelithiasis is noted, but gallbladder distention is noted. No biliary dilatation is noted. No focal abnormality is noted in the liver on these unenhanced images. Pancreas: Unremarkable. No pancreatic ductal dilatation or surrounding inflammatory changes. Spleen: Normal in size without focal abnormality. Adrenals/Urinary Tract: Adrenal glands are unremarkable. Kidneys are normal, without renal calculi, focal lesion, or hydronephrosis. Stomach/Bowel: Stomach is within normal limits. Appendix appears normal. No evidence of bowel wall thickening, distention, or inflammatory changes. Vascular/Lymphatic: Aortic atherosclerosis. No enlarged abdominal or pelvic lymph nodes. Other: No abdominal wall hernia or abnormality. No abdominopelvic ascites. Musculoskeletal: No acute or significant osseous findings. IMPRESSION: 1. Minimal bilateral pleural effusions are noted with adjacent subsegmental atelectasis. 2. No cholelithiasis is noted, but gallbladder distention is noted. If there is clinical concern for cholecystitis, ultrasound may be performed for further  evaluation. 3. No other abnormality seen in the chest or abdomen. Aortic Atherosclerosis (ICD10-I70.0). Electronically Signed   By: Marijo Conception M.D.   On: 09/18/2019 10:00   DG Chest 2 View  Result Date: 09/18/2019 CLINICAL DATA:  Shortness of breath EXAM: CHEST - 2 VIEW COMPARISON:  01/03/2008 FINDINGS: Trace right pleural effusion. Indistinct opacity at the bases. Normal heart size. No pneumothorax IMPRESSION: Indistinct opacity at the bases which could be atelectasis or pneumonia. Trace right pleural effusion. Electronically Signed   By: Monte Fantasia M.D.   On: 09/18/2019 07:12   CT Chest Wo Contrast  Result Date: 09/18/2019 CLINICAL DATA:  Chest pain, epigastric abdominal pain. EXAM: CT CHEST AND ABDOMEN WITHOUT CONTRAST TECHNIQUE: Multidetector CT imaging of the chest and abdomen was performed following the standard protocol without intravenous contrast. COMPARISON:  May 21, 2010. FINDINGS: CT CHEST FINDINGS WITHOUT CONTRAST Cardiovascular: Atherosclerosis of thoracic aorta is noted without aneurysm formation. Normal cardiac size. No pericardial effusion. Mediastinum/Nodes: No enlarged mediastinal or axillary lymph nodes. Thyroid gland, trachea, and esophagus demonstrate no significant findings. Lungs/Pleura: No pneumothorax is noted. Minimal bilateral pleural effusions are noted with adjacent subsegmental atelectasis. Musculoskeletal: No chest wall mass or suspicious bone lesions identified. CT ABDOMEN FINDINGS WITHOUT CONTRAST Hepatobiliary: No cholelithiasis is noted, but gallbladder distention is noted. No biliary dilatation is noted. No focal abnormality is noted in the liver on these unenhanced images. Pancreas: Unremarkable. No pancreatic ductal dilatation or surrounding inflammatory changes. Spleen: Normal in size without focal abnormality. Adrenals/Urinary Tract: Adrenal glands are unremarkable. Kidneys are normal, without renal calculi, focal lesion, or hydronephrosis.  Stomach/Bowel: Stomach is within normal limits. Appendix appears normal. No evidence of bowel wall thickening, distention, or inflammatory changes. Vascular/Lymphatic: Aortic atherosclerosis. No enlarged abdominal or pelvic lymph nodes. Other: No abdominal wall hernia or abnormality. No abdominopelvic ascites. Musculoskeletal: No acute or significant osseous findings. IMPRESSION: 1. Minimal bilateral pleural effusions are noted with adjacent subsegmental atelectasis. 2. No cholelithiasis is noted, but gallbladder distention is noted. If there is clinical  concern for cholecystitis, ultrasound may be performed for further evaluation. 3. No other abnormality seen in the chest or abdomen. Aortic Atherosclerosis (ICD10-I70.0). Electronically Signed   By: Lupita Raider M.D.   On: 09/18/2019 10:00   US Venous Img Lower Bilateral  Result Date: 09/18/2019 CLINICAL DATA:  Bilateral extremity swelling EXAM: BILATERAL LOWER EXTREMITY VENOUS DOPPLER ULTRASOUND TECHNIQUE: Gray-scale sonography with graded compression, as well as color Doppler and duplex ultrasound were performed to evaluate the lower extremity deep venous systems from the level of the common femoral vein and including the common femoral, femoral, profunda femoral, popliteal and calf veins including the posterior tibial, peroneal and gastrocnemius veins when visible. The superficial great saphenous vein was also interrogated. Spectral Doppler was utilized to evaluate flow at rest and with distal augmentation maneuvers in the common femoral, femoral and popliteal veins. COMPARISON:  None. FINDINGS: RIGHT LOWER EXTREMITY Common Femoral Vein: No evidence of thrombus. Normal compressibility, respiratory phasicity and response to augmentation. Saphenofemoral Junction: No evidence of thrombus. Normal compressibility and flow on color Doppler imaging. Profunda Femoral Vein: No evidence of thrombus. Normal compressibility and flow on color Doppler imaging. Femoral  Vein: No evidence of thrombus. Normal compressibility, respiratory phasicity and response to augmentation. Popliteal Vein: No evidence of thrombus. Normal compressibility, respiratory phasicity and response to augmentation. Calf Veins: No evidence of thrombus. Normal compressibility and flow on color Doppler imaging. LEFT LOWER EXTREMITY Common Femoral Vein: No evidence of thrombus. Normal compressibility, respiratory phasicity and response to augmentation. Saphenofemoral Junction: No evidence of thrombus. Normal compressibility and flow on color Doppler imaging. Profunda Femoral Vein: No evidence of thrombus. Normal compressibility and flow on color Doppler imaging. Femoral Vein: No evidence of thrombus. Normal compressibility, respiratory phasicity and response to augmentation. Popliteal Vein: No evidence of thrombus. Normal compressibility, respiratory phasicity and response to augmentation. Calf Veins: No evidence of thrombus. Normal compressibility and flow on color Doppler imaging. IMPRESSION: No evidence of deep venous thrombosis in either lower extremity. Electronically Signed   By: Judie Petit.  Shick M.D.   On: 09/18/2019 10:10    Imaging studies reviewed, most notable for no DVT, bilateral small pleural effusions  Mild gallbladder distention is noted, but clinically given the patient's history and examination finding I do not believe this to be an acute concern at this point. ____________________________________________   PROCEDURES  Procedure(s) performed: None  Procedures  Critical Care performed: No  ____________________________________________   INITIAL IMPRESSION / ASSESSMENT AND PLAN / ED COURSE  Pertinent labs & imaging results that were available during my care of the patient were reviewed by me and considered in my medical decision making (see chart for details).   Patient developing slight edema and now having some shortness of breath with laying flat.  Relieved by sitting up or  sleeping in a recliner.  Associated with a very mild discomfort more right-sided, atypical for ACS.  Troponin normal after several days of symptoms.  No pleuritic chest pain.  No signs or symptoms of DVT or PE, and discussed risks and benefits of obtaining a CT scan with IV dye to evaluate further for shortness of breath, but on her noncontrasted study I suspect that her small bilateral pleural effusions as well as mild bilateral lower extremity edema developing over a week that this is likely volume overload possibly early symptoms of CHF.  Potentially renal, other etiologies considered.  LFTs normal no ascites  No new abdominal symptoms, chronic mild epigastric discomfort per patient follows with [facility] for this as  well.  Clinical Course as of Sep 18 1103  Wed Sep 18, 2019  0843 WBC(!): 11.3 [MQ]  0843 GFR, Est Non African American(!): 35 [MQ]  0843 Creatinine(!): 1.47 [MQ]  0843 Glucose(!): 224 [MQ]    Clinical Course User Index [MQ] Delman Kitten, MD   Case reviewed in detail with Dr. Ubaldo Glassing; advises close outpatient evaluation, can see the patient tomorrow at 11 AM.  Discussed the patient and she understands this and is agreeable with follow-up tomorrow with cardiology at 11 AM.  He recommends starting her on a low-dose of Lasix, anticipates they will obtain an echocardiogram for tomorrow.  She is not hypoxic in no distress, I think this is a reasonable plan will have very close follow-up and further work-up.  She is resting comfortably no distress.  Careful return precautions discussed with both patient and husband are agreeable  ____________________________________________   FINAL CLINICAL IMPRESSION(S) / ED DIAGNOSES  Final diagnoses:  Generalized edema  Bilateral pleural effusion        Note:  This document was prepared using Dragon voice recognition software and may include unintentional dictation errors       Delman Kitten, MD 09/18/19 1107

## 2019-09-18 NOTE — ED Notes (Signed)
Pt ambulatory to bathroom with a steady gait. Spouse at bedside at this time.

## 2019-09-19 ENCOUNTER — Ambulatory Visit
Admission: RE | Admit: 2019-09-19 | Discharge: 2019-09-19 | Disposition: A | Discharge status: Home / Self Care | Payer: Medicare Other | Source: Ambulatory Visit | Attending: Rehabilitative and Restorative Service Providers" | Admitting: Rehabilitative and Restorative Service Providers"

## 2019-09-19 ENCOUNTER — Other Ambulatory Visit (HOSPITAL_COMMUNITY): Payer: Self-pay | Admitting: Rehabilitative and Restorative Service Providers"

## 2019-09-19 ENCOUNTER — Other Ambulatory Visit: Payer: Self-pay

## 2019-09-19 ENCOUNTER — Other Ambulatory Visit: Payer: Self-pay | Admitting: Rehabilitative and Restorative Service Providers"

## 2019-09-19 DIAGNOSIS — R1011 Right upper quadrant pain: Secondary | ICD-10-CM | POA: Diagnosis not present

## 2019-09-29 ENCOUNTER — Emergency Department
Admission: EM | Admit: 2019-09-29 | Discharge: 2019-09-29 | Disposition: A | Discharge status: Home / Self Care | Payer: Medicare Other | Attending: Emergency Medicine | Admitting: Emergency Medicine

## 2019-09-29 ENCOUNTER — Other Ambulatory Visit: Payer: Self-pay

## 2019-09-29 ENCOUNTER — Emergency Department: Payer: Medicare Other

## 2019-09-29 DIAGNOSIS — R0602 Shortness of breath: Secondary | ICD-10-CM | POA: Diagnosis present

## 2019-09-29 DIAGNOSIS — R0600 Dyspnea, unspecified: Secondary | ICD-10-CM

## 2019-09-29 DIAGNOSIS — E119 Type 2 diabetes mellitus without complications: Secondary | ICD-10-CM | POA: Insufficient documentation

## 2019-09-29 DIAGNOSIS — I10 Essential (primary) hypertension: Secondary | ICD-10-CM | POA: Diagnosis not present

## 2019-09-29 LAB — TROPONIN I (HIGH SENSITIVITY)
Troponin I (High Sensitivity): 4 ng/L (ref <18)
Troponin I (High Sensitivity): 4 ng/L (ref <18)

## 2019-09-29 LAB — BASIC METABOLIC PANEL
Anion gap: 17 — ABNORMAL HIGH (ref 5–15)
BUN: 43 mg/dL — ABNORMAL HIGH (ref 8–23)
CO2: 25 mmol/L (ref 22–32)
Calcium: 9.7 mg/dL (ref 8.9–10.3)
Chloride: 97 mmol/L — ABNORMAL LOW (ref 98–111)
Creatinine, Ser: 1.69 mg/dL — ABNORMAL HIGH (ref 0.44–1.00)
GFR calc Af Amer: 34 mL/min — ABNORMAL LOW (ref >60)
GFR calc non Af Amer: 30 mL/min — ABNORMAL LOW (ref >60)
Glucose, Bld: 222 mg/dL — ABNORMAL HIGH (ref 70–99)
Potassium: 3.6 mmol/L (ref 3.5–5.1)
Sodium: 139 mmol/L (ref 135–145)

## 2019-09-29 LAB — CBC
HCT: 36.8 % (ref 36.0–46.0)
Hemoglobin: 12.1 g/dL (ref 12.0–15.0)
MCH: 28.4 pg (ref 26.0–34.0)
MCHC: 32.9 g/dL (ref 30.0–36.0)
MCV: 86.4 fL (ref 80.0–100.0)
Platelets: 468 10*3/uL — ABNORMAL HIGH (ref 150–400)
RBC: 4.26 MIL/uL (ref 3.87–5.11)
RDW: 14.6 % (ref 11.5–15.5)
WBC: 13.4 10*3/uL — ABNORMAL HIGH (ref 4.0–10.5)
nRBC: 0 % (ref 0.0–0.2)

## 2019-09-29 LAB — BRAIN NATRIURETIC PEPTIDE: B Natriuretic Peptide: 28.3 pg/mL (ref 0.0–100.0)

## 2019-09-29 MED ORDER — LORAZEPAM 1 MG PO TABS
1.0000 mg | ORAL_TABLET | Freq: Once | ORAL | Status: AC
  Administered 2019-09-29: 1 mg via ORAL
  Filled 2019-09-29: qty 1

## 2019-09-29 MED ORDER — SODIUM CHLORIDE 0.9 % IV BOLUS
500.0000 mL | Freq: Once | INTRAVENOUS | Status: AC
  Administered 2019-09-29: 500 mL via INTRAVENOUS

## 2019-09-29 MED ORDER — SODIUM CHLORIDE 0.9% FLUSH: 3.0000 mL | Freq: Once | INTRAVENOUS | Status: DC

## 2019-09-29 MED ORDER — LORAZEPAM 1 MG PO TABS
1.0000 mg | ORAL_TABLET | Freq: Every day | ORAL | 0 refills | Status: AC | PRN
Start: 2019-09-29 — End: 2020-09-28

## 2019-09-29 NOTE — ED Provider Notes (Signed)
ER Provider Note       Time seen: 4:39 PM   I have reviewed the vital signs and the nursing notes.  HISTORY   Chief Complaint Shortness of Breath   HPI Marisa Marsh is a 74 y.o. female with a history of arthritis, breast cancer, breast mass, diabetes, hypertension who presents today for shortness of breath and chest pain.  Patient states that started today.  She had trouble breathing yesterday.  Does have this from time to time, does not significantly worse with ambulation, but she does note it with extensive physical activity.  She denies any recent illness or other complaints.  Past Medical History:  Diagnosis Date  . Arthritis   . Breast cancer ([place]) 2009   Left- radiation  . Breast mass, right 02/12/2018   BENIGN MAMMARY PARENCHYMA SHOWING DENSE PERIDUCTAL FIBROSIS   . Breast screening, unspecified   . Cancer [facility]) 2009   left breast wide excision with sn bx left axillary dissection. The nodes were clinically positive but negative on path review. She had 1.7 cm tumor that was ER positive and not oveerly expressing for HER2/neu. Her oncotype dx testing put her at low risk for metastatic disease.  . Diabetes mellitus without complication ([place])   . Disc 2009   bulging discs  . Gout 2005  . Hypertension   . Liver disease   . Lump or mass in breast   . Malignant neoplasm of upper-outer quadrant of female breast ([place])   . Obesity, unspecified   . Personal history of malignant neoplasm of breast 2009   left breast  . Personal history of radiation therapy   . Special screening for malignant neoplasms, colon     Past Surgical History:  Procedure Laterality Date  . BACK SURGERY    . BREAST BIOPSY Left 2009   ID[facility]  . BREAST BIOPSY Left 2012   neg  . BREAST BIOPSY Right 02/12/2018   Procedure: BREAST BIOPSY;  Surgeon: Robert Bellow, MD;  Location: [facility] ORS;  Service: General;  Laterality: Right;  . BREAST LUMPECTOMY Left 2009   w/ radiation  . BREAST SURGERY Left  01/08/2008   left breast lumpectomy with sn biopsy  . CARPAL TUNNEL RELEASE Right Dec 2014  . COLONOSCOPY  2013, 2015   Dr. Bary Castilla, 2.5 cm sessile polyp in the cecum, pathology showed a tubular adenoma without dysplasia.  . COLONOSCOPY WITH PROPOFOL N/A 05/03/2017   Procedure: COLONOSCOPY WITH PROPOFOL;  Surgeon: Robert Bellow, MD;  Location: [facility] ENDOSCOPY;  Service: Endoscopy;  Laterality: N/A;  . TUBAL LIGATION      Allergies Simvastatin  Review of Systems Constitutional: Negative for fever. Cardiovascular: Positive for chest pain Respiratory: Positive for shortness of breath Gastrointestinal: Negative for abdominal pain, vomiting and diarrhea. Musculoskeletal: Negative for back pain. Skin: Negative for rash. Neurological: Negative for headaches, focal weakness or numbness.  All systems negative/normal/unremarkable except as stated in the HPI  ____________________________________________   PHYSICAL EXAM:  VITAL SIGNS: Vitals:   09/29/19 1353  BP: (!) 156/75  Pulse: 93  Resp: 19  Temp: 98.8 F (37.1 C)  SpO2: 98%    Constitutional: Alert and oriented. Well appearing and in no distress. Eyes: Conjunctivae are normal. Normal extraocular movements. Cardiovascular: Normal rate, regular rhythm. No murmurs, rubs, or gallops. Respiratory: Normal respiratory effort without tachypnea nor retractions. Breath sounds are clear and equal bilaterally. No wheezes/rales/rhonchi. Gastrointestinal: Soft and nontender. Normal bowel sounds Musculoskeletal: Nontender with normal range of motion in extremities.  No lower extremity tenderness nor edema. Neurologic:  Normal speech and language. No gross focal neurologic deficits are appreciated.  Skin:  Skin is warm, dry and intact. No rash noted. Psychiatric: Speech and behavior are normal.  ____________________________________________  EKG: Interpreted by me.  Sinus rhythm with rate of 93 bpm, normal PR interval, normal QRS, normal  QT  ____________________________________________   LABS (pertinent positives/negatives)  Labs Reviewed  BASIC METABOLIC PANEL - Abnormal; Notable for the following components:      Result Value   Chloride 97 (*)    Glucose, Bld 222 (*)    BUN 43 (*)    Creatinine, Ser 1.69 (*)    GFR calc non Af Amer 30 (*)    GFR calc Af Amer 34 (*)    Anion gap 17 (*)    All other components within normal limits  CBC - Abnormal; Notable for the following components:   WBC 13.4 (*)    Platelets 468 (*)    All other components within normal limits  BRAIN NATRIURETIC PEPTIDE  TROPONIN I (HIGH SENSITIVITY)  TROPONIN I (HIGH SENSITIVITY)    RADIOLOGY  Images were viewed by me CXR IMPRESSION: No active cardiopulmonary disease.  DIFFERENTIAL DIAGNOSIS  Anxiety, asthma, COPD, CHF, MI, dehydration, electrolyte abnormality, anemia  ASSESSMENT AND PLAN  Dyspnea   Plan: The patient had presented for shortness of breath and chest pain. Patient's labs did indicate some dehydration likely related to recent Lasix that she has been taking.  We have given her 500 cc of normal saline and oral Ativan.  Ativan seemed to help her symptoms significantly.  I will advise that she take the Lasix every other day starting on Tuesday.  She is cleared for outpatient follow-up.  Lenise Arena MD    Note: This note was generated in part or whole with voice recognition software. Voice recognition is usually quite accurate but there are transcription errors that can and very often do occur. I apologize for any typographical errors that were not detected and corrected.     Earleen Newport, MD 09/29/19 [phone]

## 2019-09-29 NOTE — ED Triage Notes (Signed)
Pt here for SOB and CP. Pt states this started today. Pt states trouble breathing yesterday. Pt states mid sternal CP. Pt states it has gotten worse today.  Pt states some nausea.

## 2019-11-17 ENCOUNTER — Other Ambulatory Visit: Payer: Self-pay

## 2019-11-17 ENCOUNTER — Emergency Department
Admission: EM | Admit: 2019-11-17 | Discharge: 2019-11-17 | Disposition: A | Discharge status: Home / Self Care | Payer: Medicare Other | Attending: Emergency Medicine | Admitting: Emergency Medicine

## 2019-11-17 ENCOUNTER — Encounter: Payer: Self-pay | Admitting: Emergency Medicine

## 2019-11-17 DIAGNOSIS — Y999 Unspecified external cause status: Secondary | ICD-10-CM | POA: Diagnosis not present

## 2019-11-17 DIAGNOSIS — Y929 Unspecified place or not applicable: Secondary | ICD-10-CM | POA: Diagnosis not present

## 2019-11-17 DIAGNOSIS — Y939 Activity, unspecified: Secondary | ICD-10-CM | POA: Diagnosis not present

## 2019-11-17 DIAGNOSIS — S90511A Abrasion, right ankle, initial encounter: Secondary | ICD-10-CM | POA: Insufficient documentation

## 2019-11-17 DIAGNOSIS — Z5321 Procedure and treatment not carried out due to patient leaving prior to being seen by health care provider: Secondary | ICD-10-CM | POA: Insufficient documentation

## 2019-11-17 DIAGNOSIS — W010XXA Fall on same level from slipping, tripping and stumbling without subsequent striking against object, initial encounter: Secondary | ICD-10-CM | POA: Insufficient documentation

## 2019-11-17 NOTE — ED Triage Notes (Signed)
PT to ED via POV for fall yesterday afternoon. Pt states that she tripped and landed on her right side. Pt has sling on her right arm. Pt has small abrasion on the right ankle with mild swelling. Pt states that she is having severe pain on the right side. Pt states that she is barely able to walk due to the pain. Pt is able to move fingers on right hand and has good pulses. Pt is in NAD.

## 2020-01-06 ENCOUNTER — Other Ambulatory Visit: Payer: Self-pay | Admitting: General Surgery

## 2020-01-06 ENCOUNTER — Other Ambulatory Visit: Payer: Self-pay

## 2020-01-06 DIAGNOSIS — Z1231 Encounter for screening mammogram for malignant neoplasm of breast: Secondary | ICD-10-CM

## 2020-02-12 ENCOUNTER — Ambulatory Visit
Admission: RE | Admit: 2020-02-12 | Discharge: 2020-02-12 | Disposition: A | Discharge status: Home / Self Care | Payer: Medicare Other | Source: Ambulatory Visit | Attending: General Surgery | Admitting: General Surgery

## 2020-02-12 ENCOUNTER — Other Ambulatory Visit: Payer: Self-pay

## 2020-02-12 DIAGNOSIS — Z1231 Encounter for screening mammogram for malignant neoplasm of breast: Secondary | ICD-10-CM | POA: Diagnosis present

## 2020-05-26 IMAGING — MG DIGITAL DIAGNOSTIC BILATERAL MAMMOGRAM WITH TOMO AND CAD
8 of 14 series · 8 of 40 positions shown · non-contrast
Comparison: Previous exam(s).

CLINICAL DATA: History of left breast cancer status post lumpectomy
in 4777. Patient complains of spontaneous right bloody nipple
discharge.

EXAM:
DIGITAL DIAGNOSTIC BILATERAL MAMMOGRAM WITH CAD AND TOMO
ULTRASOUND RIGHT BREAST

[R MLO synth-2D (1 of 2)]
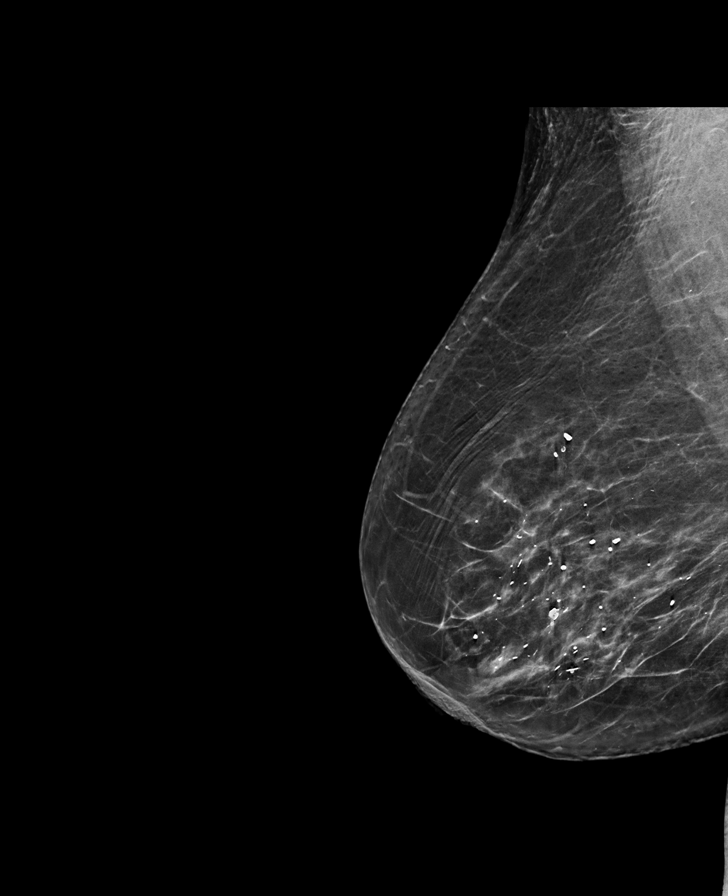

[R CC synth-2D (1 of 2)]
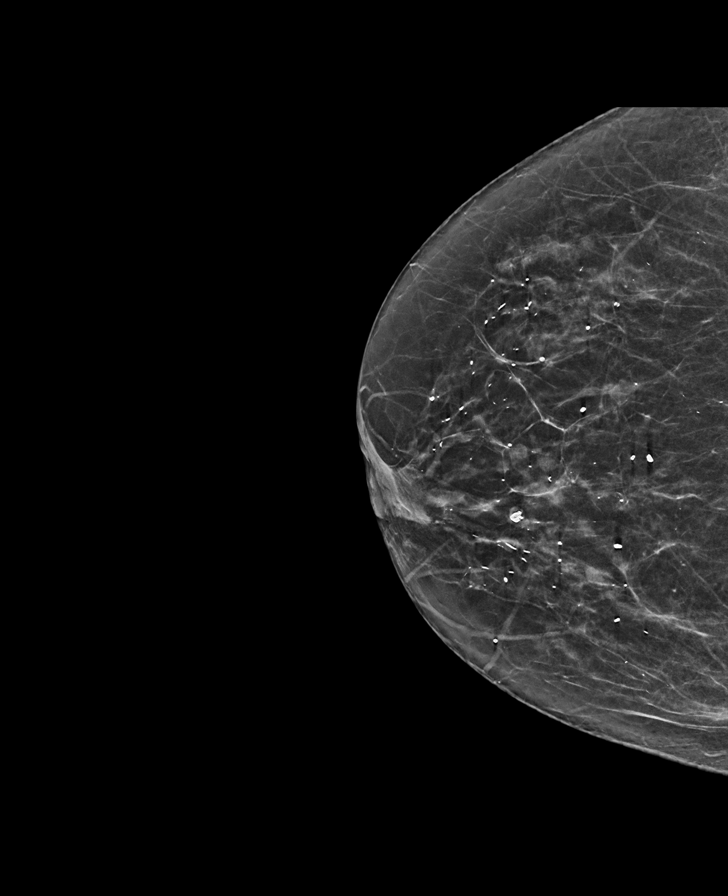

[L MLO synth-2D (1 of 2)]
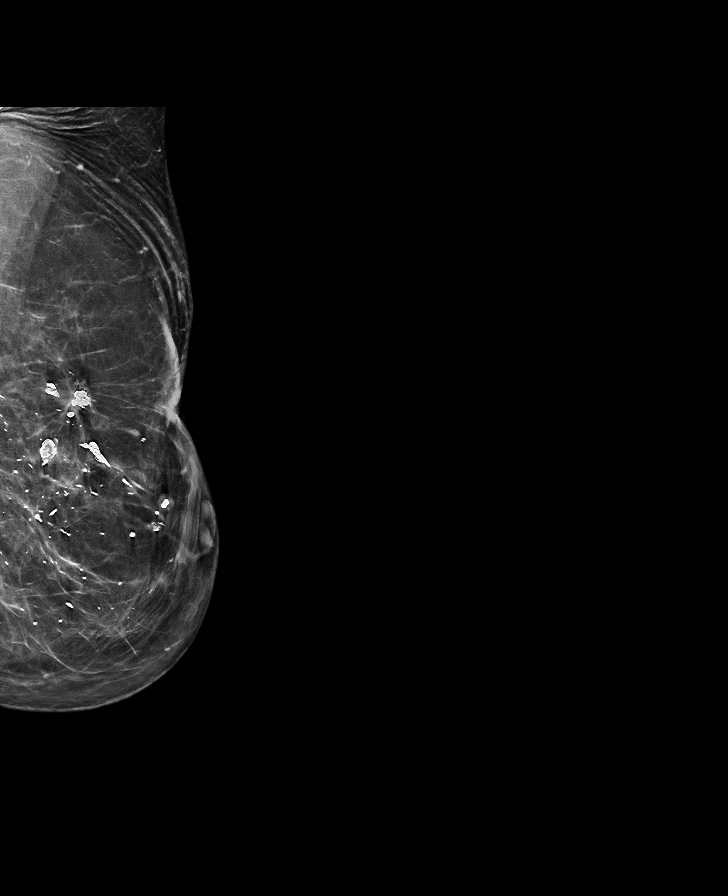

[L MLO synth-2D (2 of 2)]
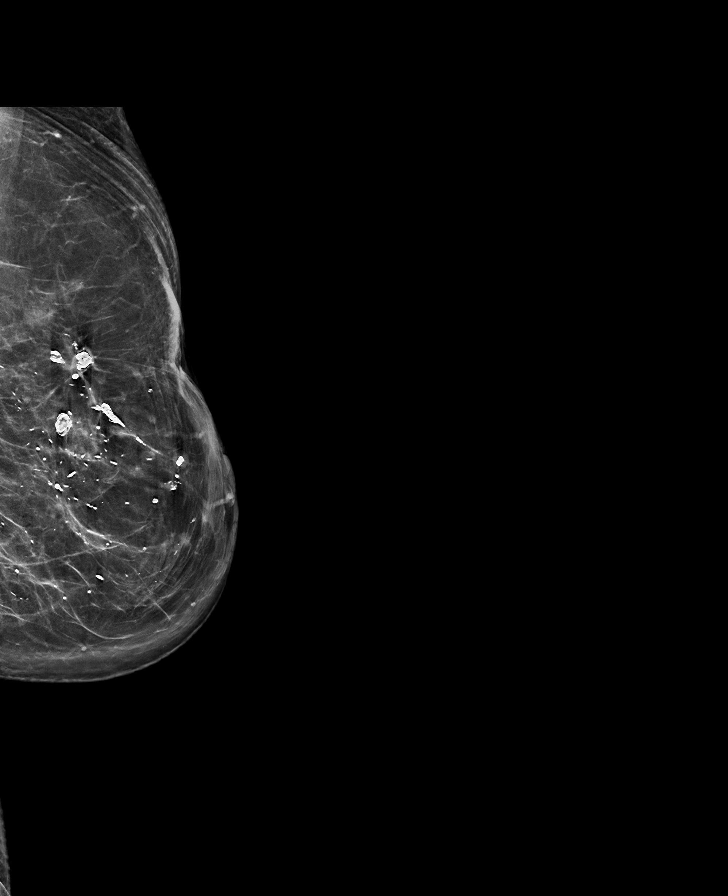

[R CC synth-2D (2 of 2)]
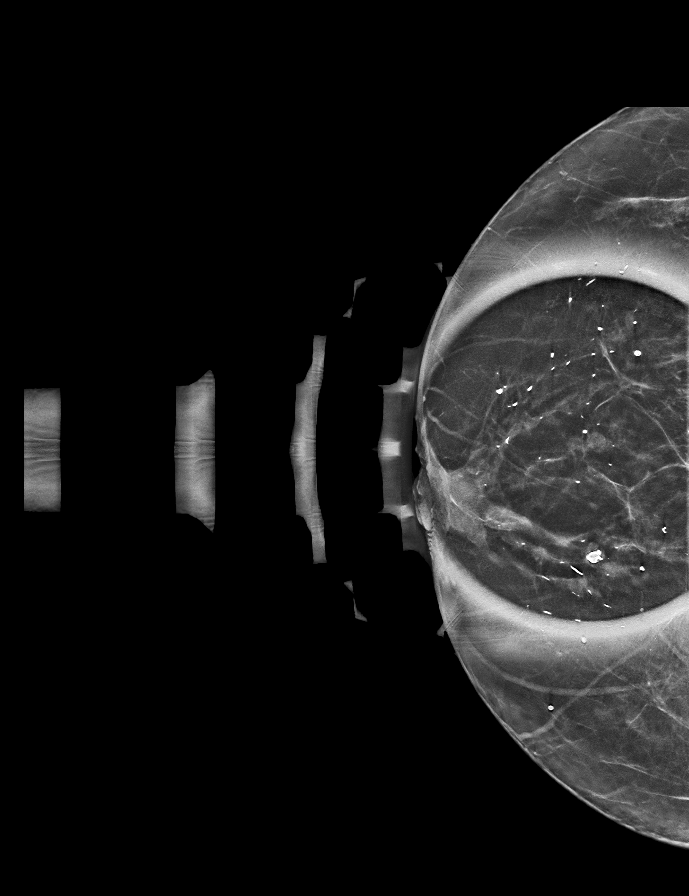

[R MLO synth-2D (2 of 2)]
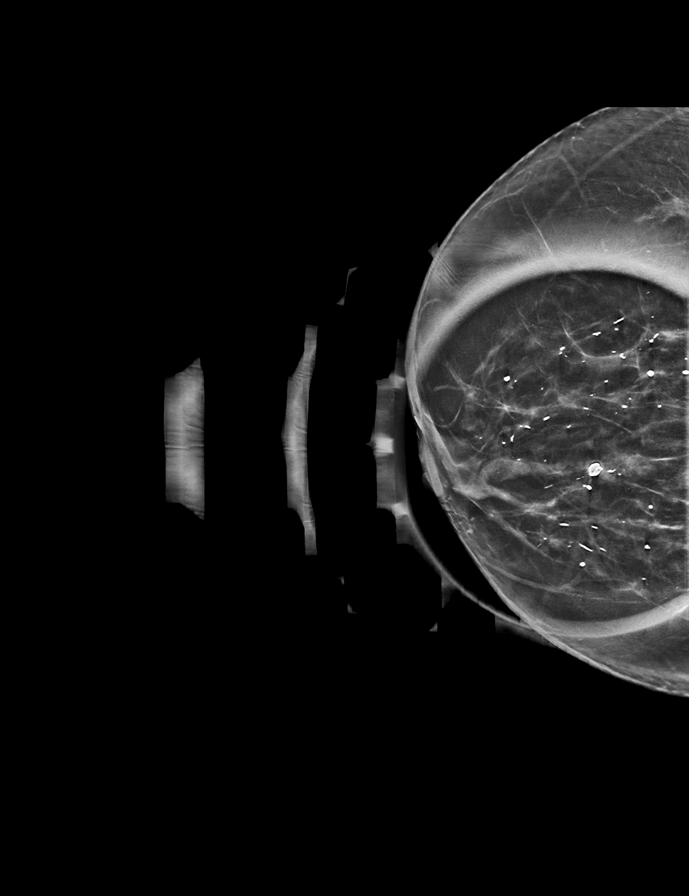

[L CC synth-2D]
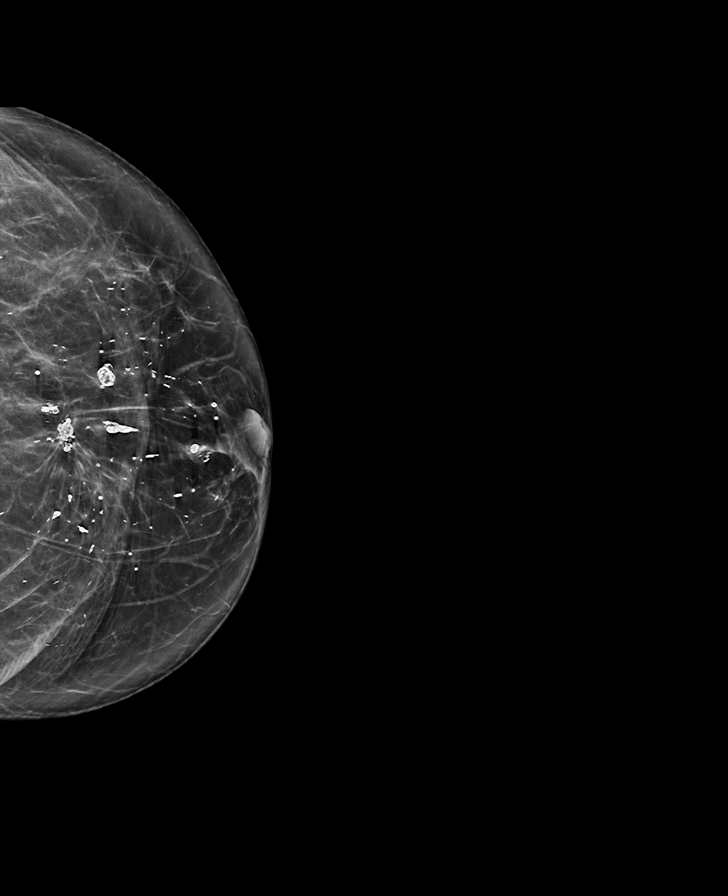

[R CC tomo · tomo slice 24/47.0]
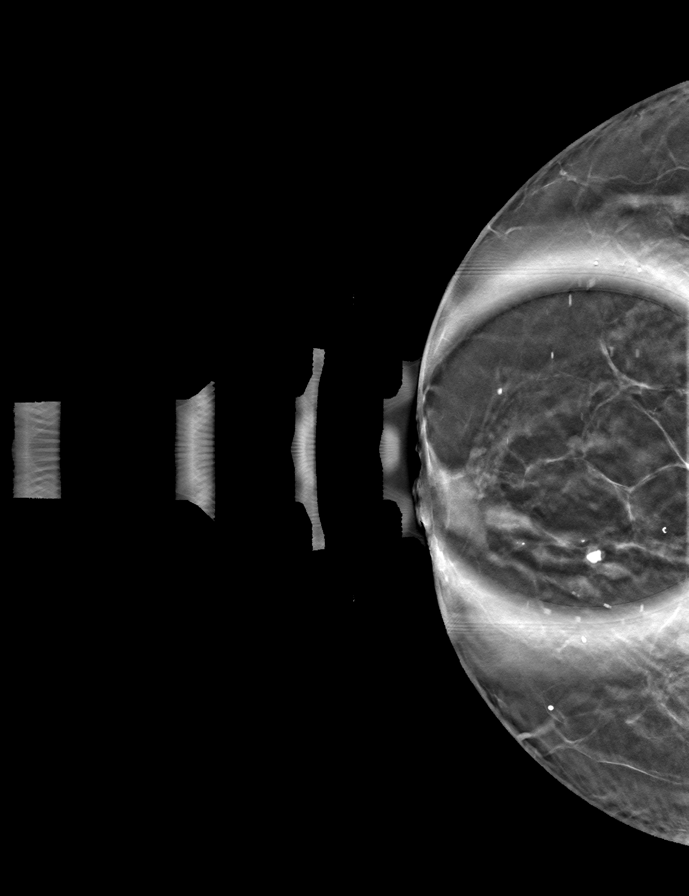

[8 of 40 positions shown; findings below may reference images not displayed]

ACR Breast Density Category b: There are scattered areas of
fibroglandular density.
FINDINGS: No suspicious mass, malignant type microcalcifications or distortion
detected. Stable lumpectomy changes are seen in the left breast.

Mammographic images were processed with CAD.

On physical exam, no mass is palpated in the subareolar region of
the right breast. Bloody nipple discharge could be expressed from a
central duct.

Targeted ultrasound is performed, showing ductal dilatation in the
12 o'clock retroareolar region of the right breast. No intraductal
mass identified.
IMPRESSION: No evidence of malignancy in either breast. The spontaneous right
bloody nipple discharge is clinically concerning for an intraductal
mass.

RECOMMENDATION:
Patient is scheduled for right breast surgery on 02/12/2018 with Dr.
Cha for the spontaneous right bloody nipple discharge. Bilateral
screening mammogram in 1 year is recommended.

I have discussed the findings and recommendations with the patient.
Results were also provided in writing at the conclusion of the
visit. If applicable, a reminder letter will be sent to the patient
regarding the next appointment.

BI-RADS CATEGORY  1: Negative.

## 2021-01-06 ENCOUNTER — Other Ambulatory Visit: Payer: Self-pay | Admitting: Internal Medicine

## 2021-01-06 DIAGNOSIS — Z1231 Encounter for screening mammogram for malignant neoplasm of breast: Secondary | ICD-10-CM

## 2021-02-12 ENCOUNTER — Ambulatory Visit
Admission: RE | Admit: 2021-02-12 | Discharge: 2021-02-12 | Disposition: A | Discharge status: Home / Self Care | Payer: Medicare Other | Source: Ambulatory Visit | Attending: Internal Medicine | Admitting: Internal Medicine

## 2021-02-12 ENCOUNTER — Other Ambulatory Visit: Payer: Self-pay

## 2021-02-12 DIAGNOSIS — Z1231 Encounter for screening mammogram for malignant neoplasm of breast: Secondary | ICD-10-CM | POA: Insufficient documentation

## 2022-02-08 ENCOUNTER — Other Ambulatory Visit: Payer: Self-pay | Admitting: Internal Medicine

## 2022-02-08 DIAGNOSIS — N6323 Unspecified lump in the left breast, lower outer quadrant: Secondary | ICD-10-CM

## 2022-02-16 ENCOUNTER — Ambulatory Visit
Admission: RE | Admit: 2022-02-16 | Discharge: 2022-02-16 | Disposition: A | Discharge status: Home / Self Care | Payer: Medicare Other | Source: Ambulatory Visit | Attending: Internal Medicine | Admitting: Internal Medicine

## 2022-02-16 DIAGNOSIS — N6323 Unspecified lump in the left breast, lower outer quadrant: Secondary | ICD-10-CM | POA: Diagnosis not present

## 2022-04-26 ENCOUNTER — Encounter: Payer: Self-pay | Admitting: Internal Medicine

## 2022-04-29 ENCOUNTER — Other Ambulatory Visit: Payer: Self-pay | Admitting: Internal Medicine

## 2022-04-29 DIAGNOSIS — N63 Unspecified lump in unspecified breast: Secondary | ICD-10-CM

## 2022-05-23 ENCOUNTER — Ambulatory Visit
Admission: RE | Admit: 2022-05-23 | Discharge: 2022-05-23 | Disposition: A | Discharge status: Home / Self Care | Payer: Medicare Other | Source: Ambulatory Visit | Attending: Internal Medicine | Admitting: Internal Medicine

## 2022-05-23 DIAGNOSIS — N63 Unspecified lump in unspecified breast: Secondary | ICD-10-CM | POA: Insufficient documentation

## 2023-01-11 ENCOUNTER — Other Ambulatory Visit: Payer: Self-pay | Admitting: Internal Medicine

## 2023-01-11 DIAGNOSIS — Z1231 Encounter for screening mammogram for malignant neoplasm of breast: Secondary | ICD-10-CM

## 2023-02-21 ENCOUNTER — Ambulatory Visit
Admission: RE | Admit: 2023-02-21 | Discharge: 2023-02-21 | Disposition: A | Discharge status: Home / Self Care | Payer: Medicare Other | Source: Ambulatory Visit | Attending: Internal Medicine | Admitting: Internal Medicine

## 2023-02-21 DIAGNOSIS — Z1231 Encounter for screening mammogram for malignant neoplasm of breast: Secondary | ICD-10-CM | POA: Insufficient documentation

## 2023-07-19 ENCOUNTER — Other Ambulatory Visit: Payer: Self-pay

## 2023-07-19 ENCOUNTER — Observation Stay

## 2023-07-19 ENCOUNTER — Observation Stay
Admission: EM | Admit: 2023-07-19 | Discharge: 2023-07-20 | Disposition: A | Discharge status: Home / Self Care | Attending: Family Medicine | Admitting: Family Medicine

## 2023-07-19 ENCOUNTER — Emergency Department

## 2023-07-19 ENCOUNTER — Observation Stay (HOSPITAL_BASED_OUTPATIENT_CLINIC_OR_DEPARTMENT_OTHER): Admit: 2023-07-19 | Discharge: 2023-07-19 | Disposition: A | Discharge status: Home / Self Care

## 2023-07-19 DIAGNOSIS — E1140 Type 2 diabetes mellitus with diabetic neuropathy, unspecified: Secondary | ICD-10-CM | POA: Diagnosis not present

## 2023-07-19 DIAGNOSIS — Z853 Personal history of malignant neoplasm of breast: Secondary | ICD-10-CM | POA: Diagnosis not present

## 2023-07-19 DIAGNOSIS — M5412 Radiculopathy, cervical region: Secondary | ICD-10-CM | POA: Diagnosis not present

## 2023-07-19 DIAGNOSIS — R4781 Slurred speech: Secondary | ICD-10-CM | POA: Diagnosis present

## 2023-07-19 DIAGNOSIS — E1122 Type 2 diabetes mellitus with diabetic chronic kidney disease: Secondary | ICD-10-CM | POA: Diagnosis not present

## 2023-07-19 DIAGNOSIS — Z7902 Long term (current) use of antithrombotics/antiplatelets: Secondary | ICD-10-CM | POA: Insufficient documentation

## 2023-07-19 DIAGNOSIS — Z9889 Other specified postprocedural states: Secondary | ICD-10-CM | POA: Insufficient documentation

## 2023-07-19 DIAGNOSIS — I129 Hypertensive chronic kidney disease with stage 1 through stage 4 chronic kidney disease, or unspecified chronic kidney disease: Secondary | ICD-10-CM | POA: Diagnosis not present

## 2023-07-19 DIAGNOSIS — G459 Transient cerebral ischemic attack, unspecified: Principal | ICD-10-CM | POA: Insufficient documentation

## 2023-07-19 DIAGNOSIS — N1832 Chronic kidney disease, stage 3b: Secondary | ICD-10-CM | POA: Insufficient documentation

## 2023-07-19 DIAGNOSIS — R531 Weakness: Secondary | ICD-10-CM

## 2023-07-19 DIAGNOSIS — Z79899 Other long term (current) drug therapy: Secondary | ICD-10-CM | POA: Diagnosis not present

## 2023-07-19 DIAGNOSIS — Z7984 Long term (current) use of oral hypoglycemic drugs: Secondary | ICD-10-CM | POA: Diagnosis not present

## 2023-07-19 DIAGNOSIS — Z7982 Long term (current) use of aspirin: Secondary | ICD-10-CM | POA: Insufficient documentation

## 2023-07-19 LAB — URINALYSIS, ROUTINE W REFLEX MICROSCOPIC
Bilirubin Urine: NEGATIVE
Glucose, UA: NEGATIVE mg/dL
Hgb urine dipstick: NEGATIVE
Ketones, ur: NEGATIVE mg/dL
Leukocytes,Ua: NEGATIVE
Nitrite: NEGATIVE
Protein, ur: NEGATIVE mg/dL
Specific Gravity, Urine: 1.009 (ref 1.005–1.030)
pH: 5 (ref 5.0–8.0)

## 2023-07-19 LAB — ECHOCARDIOGRAM COMPLETE
AR max vel: 2.34 cm2
AV Area VTI: 2.65 cm2
AV Area mean vel: 2.31 cm2
AV Mean grad: 4 mmHg
AV Peak grad: 7.5 mmHg
Ao pk vel: 1.37 m/s
Area-P 1/2: 3.23 cm2
Calc EF: 60.5 %
Height: 65 in
MV VTI: 2.34 cm2
S' Lateral: 2.9 cm
Single Plane A2C EF: 66.7 %
Single Plane A4C EF: 55 %
Weight: 2160 [oz_av]

## 2023-07-19 LAB — BASIC METABOLIC PANEL WITH GFR
Anion gap: 11 (ref 5–15)
BUN: 36 mg/dL — ABNORMAL HIGH (ref 8–23)
CO2: 19 mmol/L — ABNORMAL LOW (ref 22–32)
Calcium: 9.9 mg/dL (ref 8.9–10.3)
Chloride: 109 mmol/L (ref 98–111)
Creatinine, Ser: 2.05 mg/dL — ABNORMAL HIGH (ref 0.44–1.00)
GFR, Estimated: 25 mL/min — ABNORMAL LOW (ref >60)
Glucose, Bld: 93 mg/dL (ref 70–99)
Potassium: 4.1 mmol/L (ref 3.5–5.1)
Sodium: 139 mmol/L (ref 135–145)

## 2023-07-19 LAB — APTT: aPTT: 30 s (ref 24–36)

## 2023-07-19 LAB — PROTIME-INR
INR: 1 (ref 0.8–1.2)
Prothrombin Time: 13.4 s (ref 11.4–15.2)

## 2023-07-19 LAB — CBC
HCT: 33.7 % — ABNORMAL LOW (ref 36.0–46.0)
Hemoglobin: 10.8 g/dL — ABNORMAL LOW (ref 12.0–15.0)
MCH: 30.3 pg (ref 26.0–34.0)
MCHC: 32 g/dL (ref 30.0–36.0)
MCV: 94.4 fL (ref 80.0–100.0)
Platelets: 270 10*3/uL (ref 150–400)
RBC: 3.57 MIL/uL — ABNORMAL LOW (ref 3.87–5.11)
RDW: 14.9 % (ref 11.5–15.5)
WBC: 10 10*3/uL (ref 4.0–10.5)
nRBC: 0 % (ref 0.0–0.2)

## 2023-07-19 LAB — CBG MONITORING, ED
Glucose-Capillary: 94 mg/dL (ref 70–99)
Glucose-Capillary: 104 mg/dL — ABNORMAL HIGH (ref 70–99)

## 2023-07-19 LAB — HEMOGLOBIN A1C
Hgb A1c MFr Bld: 6.2 % — ABNORMAL HIGH (ref 4.8–5.6)
Mean Plasma Glucose: 131.24 mg/dL

## 2023-07-19 LAB — GLUCOSE, CAPILLARY
Glucose-Capillary: 103 mg/dL — ABNORMAL HIGH (ref 70–99)
Glucose-Capillary: 109 mg/dL — ABNORMAL HIGH (ref 70–99)
Glucose-Capillary: 221 mg/dL — ABNORMAL HIGH (ref 70–99)

## 2023-07-19 MED ORDER — HYDROCODONE-ACETAMINOPHEN 7.5-325 MG PO TABS
1.0000 | ORAL_TABLET | Freq: Two times a day (BID) | ORAL | Status: DC | PRN
  Administered 2023-07-20: 1 via ORAL
  Filled 2023-07-19: qty 1

## 2023-07-19 MED ORDER — INSULIN ASPART 100 UNIT/ML IJ SOLN: 0.0000 [IU] | Freq: Every day | INTRAMUSCULAR | Status: DC

## 2023-07-19 MED ORDER — SENNOSIDES-DOCUSATE SODIUM 8.6-50 MG PO TABS: 1.0000 | ORAL_TABLET | Freq: Every evening | ORAL | Status: DC | PRN

## 2023-07-19 MED ORDER — ALUM & MAG HYDROXIDE-SIMETH 200-200-20 MG/5ML PO SUSP: 30.0000 mL | Freq: Four times a day (QID) | ORAL | Status: DC | PRN

## 2023-07-19 MED ORDER — LORAZEPAM 0.5 MG PO TABS: 0.5000 mg | ORAL_TABLET | Freq: Every day | ORAL | Status: DC | PRN

## 2023-07-19 MED ORDER — LEVETIRACETAM 500 MG PO TABS
500.0000 mg | ORAL_TABLET | Freq: Three times a day (TID) | ORAL | Status: DC
  Filled 2023-07-19 (×2): qty 1

## 2023-07-19 MED ORDER — ACETAMINOPHEN 650 MG RE SUPP: 650.0000 mg | RECTAL | Status: DC | PRN

## 2023-07-19 MED ORDER — ALLOPURINOL 100 MG PO TABS
300.0000 mg | ORAL_TABLET | Freq: Every day | ORAL | Status: DC
  Administered 2023-07-19 – 2023-07-20 (×2): 300 mg via ORAL
  Filled 2023-07-19: qty 1
  Filled 2023-07-19: qty 3

## 2023-07-19 MED ORDER — NORTRIPTYLINE HCL 25 MG PO CAPS: 50.0000 mg | ORAL_CAPSULE | Freq: Every evening | ORAL | Status: DC | PRN

## 2023-07-19 MED ORDER — ENOXAPARIN SODIUM 30 MG/0.3ML IJ SOSY
30.0000 mg | PREFILLED_SYRINGE | INTRAMUSCULAR | Status: DC
  Administered 2023-07-19: 30 mg via SUBCUTANEOUS
  Filled 2023-07-19: qty 0.3

## 2023-07-19 MED ORDER — HYDRALAZINE HCL 20 MG/ML IJ SOLN: 5.0000 mg | Freq: Four times a day (QID) | INTRAMUSCULAR | Status: DC | PRN

## 2023-07-19 MED ORDER — ASPIRIN 81 MG PO TBEC
81.0000 mg | DELAYED_RELEASE_TABLET | Freq: Every day | ORAL | Status: DC
  Administered 2023-07-19: 81 mg via ORAL
  Filled 2023-07-19: qty 1

## 2023-07-19 MED ORDER — STROKE: EARLY STAGES OF RECOVERY BOOK: Freq: Once | Status: AC

## 2023-07-19 MED ORDER — ACETAMINOPHEN 160 MG/5ML PO SOLN: 650.0000 mg | ORAL | Status: DC | PRN

## 2023-07-19 MED ORDER — PRAVASTATIN SODIUM 20 MG PO TABS
80.0000 mg | ORAL_TABLET | Freq: Every day | ORAL | Status: DC
  Administered 2023-07-19: 80 mg via ORAL
  Filled 2023-07-19: qty 4

## 2023-07-19 MED ORDER — SODIUM CHLORIDE 0.9% FLUSH
3.0000 mL | Freq: Two times a day (BID) | INTRAVENOUS | Status: DC
  Administered 2023-07-19: 10 mL via INTRAVENOUS
  Administered 2023-07-19: 3 mL via INTRAVENOUS
  Administered 2023-07-20: 10 mL via INTRAVENOUS

## 2023-07-19 MED ORDER — INSULIN ASPART 100 UNIT/ML IJ SOLN
0.0000 [IU] | Freq: Three times a day (TID) | INTRAMUSCULAR | Status: DC
  Administered 2023-07-20: 3 [IU] via SUBCUTANEOUS
  Filled 2023-07-19: qty 1

## 2023-07-19 MED ORDER — LORAZEPAM 2 MG/ML IJ SOLN: 0.5000 mg | Freq: Four times a day (QID) | INTRAMUSCULAR | Status: DC | PRN

## 2023-07-19 MED ORDER — LINAGLIPTIN 5 MG PO TABS
5.0000 mg | ORAL_TABLET | Freq: Every day | ORAL | Status: DC
  Administered 2023-07-19 – 2023-07-20 (×2): 5 mg via ORAL
  Filled 2023-07-19 (×2): qty 1

## 2023-07-19 MED ORDER — INSULIN ASPART 100 UNIT/ML IJ SOLN: 0.0000 [IU] | Freq: Three times a day (TID) | INTRAMUSCULAR | Status: DC

## 2023-07-19 MED ORDER — ACETAMINOPHEN 325 MG PO TABS
650.0000 mg | ORAL_TABLET | ORAL | Status: DC | PRN
  Administered 2023-07-20: 650 mg via ORAL
  Filled 2023-07-19: qty 2

## 2023-07-19 MED ORDER — SODIUM CHLORIDE 0.9% FLUSH: 3.0000 mL | INTRAVENOUS | Status: DC | PRN

## 2023-07-19 NOTE — Plan of Care (Signed)

## 2023-07-19 NOTE — Progress Notes (Signed)
 SLP Cancellation Note  Patient Details Name: Marisa Marsh MRN: [id number] DOB: Jan 07, 1946   Cancelled treatment:       Reason Eval/Treat Not Completed: Medical issues which prohibited therapy (Per chart review, pt with pending STAT MRI. Current NIHSS = 0. CT negative. Poss TIA vs stroke. Will defer speech/language assessment pending further medical work up.)  Marisa Marsh, M.S., CCC-SLP Speech-Language Pathologist [facility] [phone] [place])  [name] 07/19/2023, 12:46 PM

## 2023-07-19 NOTE — Progress Notes (Signed)
 OT Cancellation Note  Patient Details Name: Marisa Marsh MRN: [id number] DOB: November 24, 1945   Cancelled Treatment:    Reason Eval/Treat Not Completed: Patient at procedure or test/ unavailable. Orders received, chart reviewed. Therapist attempted, patient currently out of room at MRI, then having ECHO. Will re-attempt at later date/time as medically appropriate.   Dantavious Snowball E Janny Crute 07/19/2023, 3:04 PM

## 2023-07-19 NOTE — ED Notes (Signed)
 Delay in transport due to pt getting an ECHO at bedside

## 2023-07-19 NOTE — Progress Notes (Signed)
*  PRELIMINARY RESULTS* Echocardiogram 2D Echocardiogram has been performed.  Silvana Drones 07/19/2023, 3:30 PM

## 2023-07-19 NOTE — H&P (Addendum)
 History and Physical    Marisa Marsh WUJ:[id number] DOB: [birth year] DOA: 07/19/2023  PCP: Cain Sieve, MD (Confirm with patient/family/NH records and if not entered, this has to be entered at [facility] point of entry) Patient coming from: Home  I have personally briefly reviewed patient's old medical records in [facility]  Chief Complaint: slurred speech  HPI: Marisa Marsh is a 78 y.o. female with medical history significant of IIDM, HTN, HLD, CKD stage III, breast cancer status post lumpectomy and radiation therapy, gout, OA, brought in by family member for strokelike symptoms.  Symptoms started yesterday, patient woke up yesterday morning and started to feel lightheadedness and unsteady gait but no fall no weakness.  Symptoms lasted about 2 hours resolved by itself.  Initially patient was evaluated about her glucose to be low as she has had 3 days in a row when her glucose in the morning was low and for which she already stopped glimepiride 4 days ago.  Yesterday morning however she found her glucose to be normal.  This morning, patient woke up and started to feel numbness of her lips and tingling sensation on the right arm and family found she is slurred speech.  ED Course: Afebrile, no tachycardia nonhypotensive nonhypoxic.  CT head negative for acute findings.  Glucose 93 bicarb 19 BUN 36 creatinine 2.0.  Review of Systems: As per HPI otherwise 14 point review of systems negative.    Past Medical History:  Diagnosis Date   Arthritis    Breast cancer ([facility]) 2009   Left- radiation   Breast mass, right 02/12/2018   BENIGN MAMMARY PARENCHYMA SHOWING DENSE PERIDUCTAL FIBROSIS    Breast screening, unspecified    Cancer ([facility]) 2009   left breast wide excision with sn bx left axillary dissection. The nodes were clinically positive but negative on path review. She had 1.7 cm tumor that was ER positive and not oveerly expressing for HER2/neu. Her oncotype dx testing put her at  low risk for metastatic disease.   Diabetes mellitus without complication ([facility])    Disc 2009   bulging discs   Gout 2005   Hypertension    Liver disease    Lump or mass in breast    Malignant neoplasm of upper-outer quadrant of female breast ([facility])    Obesity, unspecified    Personal history of malignant neoplasm of breast 2009   left breast   Personal history of radiation therapy    Special screening for malignant neoplasms, colon     Past Surgical History:  Procedure Laterality Date   BACK SURGERY     BREAST BIOPSY Left 2009   IDC   BREAST BIOPSY Left 2012   neg   BREAST BIOPSY Right 02/12/2018   Procedure: BREAST BIOPSY;  Surgeon: Earline Mayotte, MD;  Location: [facility] ORS;  Service: General;  Laterality: Right;   BREAST LUMPECTOMY Left 2009   w/ radiation   BREAST SURGERY Left 01/08/2008   left breast lumpectomy with sn biopsy   CARPAL TUNNEL RELEASE Right Dec 2014   COLONOSCOPY  2013, 2015   Dr. Lemar Livings, 2.5 cm sessile polyp in the cecum, pathology showed a tubular adenoma without dysplasia.   COLONOSCOPY WITH PROPOFOL N/A 05/03/2017   Procedure: COLONOSCOPY WITH PROPOFOL;  Surgeon: Earline Mayotte, MD;  Location: [facility] ENDOSCOPY;  Service: Endoscopy;  Laterality: N/A;   TUBAL LIGATION       reports that she quit smoking about 45 years ago. She quit smokeless tobacco use  about 52 years ago. She reports that she does not drink alcohol and does not use drugs.  Allergies  Allergen Reactions   Simvastatin Other (See Comments)    MUSCLE PAIN    Family History  Problem Relation Age of Onset   Breast cancer Daughter 60     Prior to Admission medications   Medication Sig Start Date End Date Taking? Authorizing Provider  allopurinol (ZYLOPRIM) 300 MG tablet Take 300 mg by mouth daily.   Yes [provider]  amLODipine (NORVASC) 10 MG tablet Take 10 mg by mouth daily.   Yes [provider]  aspirin 81 MG tablet Take 81 mg by mouth at bedtime.     Yes [provider]  Cholecalciferol (VITAMIN D3) 1000 UNITS CAPS Take 1,000 Units by mouth 2 (two) times daily.    Yes [provider]  diphenhydramine-acetaminophen (TYLENOL PM) 25-500 MG TABS tablet Take 2 tablets by mouth at bedtime as needed (for sleep).   Yes [provider]  glimepiride (AMARYL) 4 MG tablet Take 4 mg by mouth daily with breakfast.   Yes [provider]  HYDROcodone-acetaminophen (NORCO) 7.5-325 MG tablet Take 1 tablet by mouth 2 (two) times daily as needed for moderate pain.   Yes [provider]  indomethacin (INDOCIN) 50 MG capsule Take 50 mg by mouth daily as needed for moderate pain.   Yes [provider]  levETIRAcetam (KEPPRA) 500 MG tablet Take 500 mg by mouth 3 (three) times daily.   Yes [provider]  lisinopril-hydrochlorothiazide (PRINZIDE,ZESTORETIC) 20-12.5 MG per tablet Take 2 tablets by mouth every morning.   Yes [provider]  LORazepam (ATIVAN) 0.5 MG tablet Take 0.5 mg by mouth daily as needed for anxiety.   Yes [provider]  metFORMIN (GLUCOPHAGE) 1000 MG tablet Take 1,000 mg by mouth 2 (two) times daily with a meal.    Yes [provider]  nortriptyline (PAMELOR) 25 MG capsule Take 50 mg by mouth at bedtime.    Yes [provider]  pravastatin (PRAVACHOL) 80 MG tablet Take 80 mg by mouth at bedtime.    Yes [provider]  vitamin B-12 (CYANOCOBALAMIN) 500 MCG tablet Take 500 mcg by mouth daily.   Yes [provider]    Physical Exam: Vitals:   07/19/23 0946 07/19/23 0948  BP: (!) 155/65   Pulse: (!) 120   Resp: 18   Temp: 97.6 F (36.4 C)   TempSrc: Oral   SpO2: 100%   Weight:  61.2 kg  Height:  5\' 5"  (1.651 m)    Constitutional: NAD, calm, comfortable Vitals:   07/19/23 0946 07/19/23 0948  BP: (!) 155/65   Pulse: (!) 120   Resp: 18   Temp: 97.6 F (36.4 C)   TempSrc: Oral   SpO2: 100%   Weight:  61.2 kg   Height:  5\' 5"  (1.651 m)   Eyes: PERRL, lids and conjunctivae normal ENMT: Mucous membranes are moist. Posterior pharynx clear of any exudate or lesions.Normal dentition.  Neck: normal, supple, no masses, no thyromegaly Respiratory: clear to auscultation bilaterally, no wheezing, no crackles. Normal respiratory effort. No accessory muscle use.  Cardiovascular: Regular rate and rhythm, no murmurs / rubs / gallops. No extremity edema. 2+ pedal pulses. No carotid bruits.  Abdomen: no tenderness, no masses palpated. No hepatosplenomegaly. Bowel sounds positive.  Musculoskeletal: no clubbing / cyanosis. No joint deformity upper and lower extremities. Good ROM, no contractures. Normal muscle tone.  Skin: no  rashes, lesions, ulcers. No induration Neurologic: CN 2-12 grossly intact. Sensation intact, DTR normal. Strength 5/5 in all 4.  Psychiatric: Normal judgment and insight. Alert and oriented x 3. Normal mood.     Labs on Admission: I have personally reviewed following labs and imaging studies  CBC: Recent Labs  Lab 07/19/23 1013  WBC 10.0  HGB 10.8*  HCT 33.7*  MCV 94.4  PLT 270   Basic Metabolic Panel: Recent Labs  Lab 07/19/23 1013  NA 139  K 4.1  CL 109  CO2 19*  GLUCOSE 93  BUN 36*  CREATININE 2.05*  CALCIUM 9.9   GFR: Estimated Creatinine Clearance: 20.7 mL/min (A) (by C-G formula based on SCr of 2.05 mg/dL (H)). Liver Function Tests: No results for input(s): "AST", "ALT", "ALKPHOS", "BILITOT", "PROT", "ALBUMIN" in the last 168 hours. No results for input(s): "LIPASE", "AMYLASE" in the last 168 hours. No results for input(s): "AMMONIA" in the last 168 hours. Coagulation Profile: Recent Labs  Lab 07/19/23 1013  INR 1.0   Cardiac Enzymes: No results for input(s): "CKTOTAL", "CKMB", "CKMBINDEX", "TROPONINI" in the last 168 hours. BNP (last 3 results) No results for input(s): "PROBNP" in the last 8760 hours. HbA1C: No results for input(s): "HGBA1C" in the last  72 hours. CBG: Recent Labs  Lab 07/19/23 1008  GLUCAP 94   Lipid Profile: No results for input(s): "CHOL", "HDL", "LDLCALC", "TRIG", "CHOLHDL", "LDLDIRECT" in the last 72 hours. Thyroid Function Tests: No results for input(s): "TSH", "T4TOTAL", "FREET4", "T3FREE", "THYROIDAB" in the last 72 hours. Anemia Panel: No results for input(s): "VITAMINB12", "FOLATE", "FERRITIN", "TIBC", "IRON", "RETICCTPCT" in the last 72 hours. Urine analysis:    Component Value Date/Time   COLORURINE STRAW (A) 09/18/2019 0600   APPEARANCEUR CLEAR (A) 09/18/2019 0600   LABSPEC 1.013 09/18/2019 0600   PHURINE 6.0 09/18/2019 0600   GLUCOSEU 50 (A) 09/18/2019 0600   HGBUR NEGATIVE 09/18/2019 0600   BILIRUBINUR NEGATIVE 09/18/2019 0600   KETONESUR 5 (A) 09/18/2019 0600   PROTEINUR 30 (A) 09/18/2019 0600   NITRITE NEGATIVE 09/18/2019 0600   LEUKOCYTESUR NEGATIVE 09/18/2019 0600    Radiological Exams on Admission: CT Head Wo Contrast Result Date: 07/19/2023 CLINICAL DATA:  Slurred speech and weakness upon wakening today, now resolved. EXAM: CT HEAD WITHOUT CONTRAST TECHNIQUE: Contiguous axial images were obtained from the base of the skull through the vertex without intravenous contrast. RADIATION DOSE REDUCTION: This exam was performed according to the departmental dose-optimization program which includes automated exposure control, adjustment of the mA and/or kV according to patient size and/or use of iterative reconstruction technique. COMPARISON:  None Available. FINDINGS: Brain: Mild age related volume loss. No evidence of old or acute focal infarction, mass lesion, hemorrhage, hydrocephalus or extra-axial collection. Vascular: There is atherosclerotic calcification of the major vessels at the base of the brain. Skull: Negative Sinuses/Orbits: Clear/normal Other: None IMPRESSION: No acute CT finding. Mild age related volume loss. Atherosclerotic calcification of the major vessels at the base of the brain.  Electronically Signed   By: Bettylou Brunner M.D.   On: 07/19/2023 10:44    EKG: Independently reviewed.  Sinus rhythm, no acute ST changes.  Assessment/Plan Principal Problem:   TIA (transient ischemic attack)  (please populate well all problems here in Problem List. (For example, if patient is on BP meds at home and you resume or decide to hold them, it is a problem that needs to be her. Same for CAD, COPD, HLD and so on)  Acute aphasia TIA -  Brain MRI without contrast, MRA without contrast -Echo - Allow permissive hypertension -PT/OT/Speech -Other Ddx, hypoglycemia/or stringent glucose control should be considered.  Check A1c, hold off glimepiride and metformin  IIDM -SSI - Hold off glimepiride and metformin  HTN - Hold off home BP meds - As needed hydralazine  CKD stage IIIa - Euvolemic, overall gradually worsening of kidney function - Hold off glimepiride and metformin. - Start Januvia  Cervical spine radiculopathy - Taking narcotics and off label Keppra, ordered by her pain management doctor  DVT prophylaxis: Lovenox Code Status: Full code Family Communication: Husband and daughter at bedside Disposition Plan: Expect less than 2 midnight hospital Consults called: None Admission status: Telemetry observation   [place] MD [facility] Pager [phone]  07/19/2023, 11:54 AM

## 2023-07-19 NOTE — ED Triage Notes (Signed)
 Pt arrives via POV from home with c/o weakness. Per pt they were not able to get out of bed this morning and when they were able to get up and make it to the couch, she wasn't able to get herself back up. Pt and family also reports slurred speech, with unintelligible speech this morning that but per family was better now. Pt reports she also experienced the slurred speech and unintelligible words yesterday. Pt also reports numbness in their lips. LKW was last night and pt hasn't had any of their daily meds this morning. Pt is A&Ox4. Pt denies CP, SOB, N/V/D. Pt walked in from POV.

## 2023-07-19 NOTE — ED Notes (Signed)
 Pt has not physically arrived in room yet and is in MRI.

## 2023-07-19 NOTE — Progress Notes (Signed)
 PT Cancellation Note  Patient Details Name: Marisa Marsh MRN: [id number] DOB: 02/25/1946   Cancelled Treatment:    Reason Eval/Treat Not Completed: Patient at procedure or test/unavailable: Orders Received, Chart Reviewed. Therapist attempted, patient currently OTF at MRI. Will re-attempt at later date/time as medically appropriate.    Simone Rodenbeck M Fairly, PT, DPT 07/19/23 1:59 PM

## 2023-07-19 NOTE — ED Provider Notes (Signed)
 [facility] Provider Note    Event Date/Time   First MD Initiated Contact with Patient 07/19/23 [phone]     (approximate)   History   Weakness   HPI Marisa Marsh is a 78 y.o. female with history of HTN, HLD, DM2, diabetic neuropathy presenting today for slurred speech.  Patient states yesterday shortly after waking up she had onset of weakness in her lower extremities where she could not get up and walk and also had associated slurred speech.  She was not aware of any unilateral weakness being worse or numbness.  Symptoms resolved within 30 minutes.  Today she woke up again with onset of lower extremity weakness and slurred speech.  Symptoms lasted approximately 2 to 3 hours before resolving prior to the ED.  Once again could not get any specific one-sided weakness or numbness.  Currently asymptomatic.  Denies any acute vision changes in the past 2 days.  No facial droop per family.  Otherwise denies fever, cough, congestion, chest pain, shortness of breath, abdominal pain, nausea, vomiting, dysuria.     Physical Exam   Triage Vital Signs: ED Triage Vitals  Encounter Vitals Group     BP 07/19/23 0946 (!) 155/65     Systolic BP Percentile --      Diastolic BP Percentile --      Pulse Rate 07/19/23 0946 (!) 120     Resp 07/19/23 0946 18     Temp 07/19/23 0946 97.6 F (36.4 C)     Temp Source 07/19/23 0946 Oral     SpO2 07/19/23 0946 100 %     Weight 07/19/23 0948 135 lb (61.2 kg)     Height 07/19/23 0948 5\' 5"  (1.651 m)     Head Circumference --      Peak Flow --      Pain Score 07/19/23 0946 4     Pain Loc --      Pain Education --      Exclude from Growth Chart --     Most recent vital signs: Vitals:   07/19/23 0946  BP: (!) 155/65  Pulse: (!) 120  Resp: 18  Temp: 97.6 F (36.4 C)  SpO2: 100%   Physical Exam: I have reviewed the vital signs and nursing notes. General: Awake, alert, no acute distress.  Nontoxic appearing. Head:   Atraumatic, normocephalic.   ENT:  EOM intact, PERRL. Oral mucosa is pink and moist with no lesions. Neck: Neck is supple with full range of motion, No meningeal signs. Cardiovascular:  RRR, No murmurs. Peripheral pulses palpable and equal bilaterally. Respiratory:  Symmetrical chest wall expansion.  No rhonchi, rales, or wheezes.  Good air movement throughout.  No use of accessory muscles.   Musculoskeletal:  No cyanosis or edema. Moving extremities with full ROM Abdomen:  Soft, nontender, nondistended. Neuro:  GCS 15, moving all four extremities, interacting appropriately. Alert and oriented with cogent speech; 5/5 motor strength all 4 extremities with intact peripheral nerve distributions; gross sensory intact to challenge in all 4 extremities and peripheral nerve distributions; no clonus; cerebellar examination normal including Romberg, finger-to-nose, heel-to-shin, and without trunkal ataxia, dysdiadochokinesis or dysmetria; cranial nerves 2-12 intact to gross challenge bilaterally. Psych:  Calm, appropriate.   Skin:  Warm, dry, no rash.    ED Results / Procedures / Treatments   Labs (all labs ordered are listed, but only abnormal results are displayed) Labs Reviewed  CBC - Abnormal; Notable for the following components:  Result Value   RBC 3.57 (*)    Hemoglobin 10.8 (*)    HCT 33.7 (*)    All other components within normal limits  PROTIME-INR  APTT  BASIC METABOLIC PANEL WITH GFR  URINALYSIS, ROUTINE W REFLEX MICROSCOPIC  CBG MONITORING, ED     EKG My EKG interpretation: Rate of 113, sinus tachycardia.  Normal axis, normal intervals.  No acute ST elevations or depressions   RADIOLOGY Independently interpreted CT head with no acute pathology   PROCEDURES:  Critical Care performed: No  Procedures   MEDICATIONS ORDERED IN ED: Medications - No data to display   IMPRESSION / MDM / ASSESSMENT AND PLAN / ED COURSE  I reviewed the triage vital signs and the  nursing notes.                              Differential diagnosis includes, but is not limited to, dehydration, CVA, TIA, electrolyte abnormality  Patient's presentation is most consistent with acute presentation with potential threat to life or bodily function.  Patient is a 78 year old female presenting today for 2 episodes in the last 24 hours of slurred speech with weakness.  Currently asymptomatic with a NIHSS of 0.  Highest concern for TIA at this time.  Vital signs otherwise with slight tachycardia mostly with movement but otherwise stable.  Exam unremarkable.  Laboratory workup with normal CBC, INR.  Blood sugar normal.  BMP shows slight AKI from her baseline.  CT head shows no acute intracranial pathology.  Given multiple episodes in the past 2 days with worsening duration and concern for TIA, will admit to hospitalist for further TIA workup.  The patient is on the cardiac monitor to evaluate for evidence of arrhythmia and/or significant heart rate changes.     FINAL CLINICAL IMPRESSION(S) / ED DIAGNOSES   Final diagnoses:  Slurred speech  Weakness     Rx / DC Orders   ED Discharge Orders     None        Note:  This document was prepared using Dragon voice recognition software and may include unintentional dictation errors.   Kandee Orion, MD 07/19/23 [phone]

## 2023-07-20 DIAGNOSIS — G459 Transient cerebral ischemic attack, unspecified: Secondary | ICD-10-CM | POA: Diagnosis not present

## 2023-07-20 LAB — CBC
HCT: 32.5 % — ABNORMAL LOW (ref 36.0–46.0)
Hemoglobin: 10.9 g/dL — ABNORMAL LOW (ref 12.0–15.0)
MCH: 30 pg (ref 26.0–34.0)
MCHC: 33.5 g/dL (ref 30.0–36.0)
MCV: 89.5 fL (ref 80.0–100.0)
Platelets: 308 10*3/uL (ref 150–400)
RBC: 3.63 MIL/uL — ABNORMAL LOW (ref 3.87–5.11)
RDW: 14.8 % (ref 11.5–15.5)
WBC: 7.9 10*3/uL (ref 4.0–10.5)
nRBC: 0 % (ref 0.0–0.2)

## 2023-07-20 LAB — COMPREHENSIVE METABOLIC PANEL WITH GFR
ALT: 11 U/L (ref 0–44)
AST: 18 U/L (ref 15–41)
Albumin: 3.9 g/dL (ref 3.5–5.0)
Alkaline Phosphatase: 22 U/L — ABNORMAL LOW (ref 38–126)
Anion gap: 10 (ref 5–15)
BUN: 29 mg/dL — ABNORMAL HIGH (ref 8–23)
CO2: 24 mmol/L (ref 22–32)
Calcium: 9.9 mg/dL (ref 8.9–10.3)
Chloride: 103 mmol/L (ref 98–111)
Creatinine, Ser: 1.8 mg/dL — ABNORMAL HIGH (ref 0.44–1.00)
GFR, Estimated: 29 mL/min — ABNORMAL LOW (ref >60)
Glucose, Bld: 266 mg/dL — ABNORMAL HIGH (ref 70–99)
Potassium: 4.3 mmol/L (ref 3.5–5.1)
Sodium: 137 mmol/L (ref 135–145)
Total Bilirubin: 0.7 mg/dL (ref 0.0–1.2)
Total Protein: 7.4 g/dL (ref 6.5–8.1)

## 2023-07-20 LAB — LIPID PANEL
Cholesterol: 129 mg/dL (ref 0–200)
HDL: 46 mg/dL (ref >40)
LDL Cholesterol: 60 mg/dL (ref 0–99)
Total CHOL/HDL Ratio: 2.8 ratio
Triglycerides: 113 mg/dL (ref <150)
VLDL: 23 mg/dL (ref 0–40)

## 2023-07-20 LAB — GLUCOSE, CAPILLARY
Glucose-Capillary: 86 mg/dL (ref 70–99)
Glucose-Capillary: 212 mg/dL — ABNORMAL HIGH (ref 70–99)

## 2023-07-20 MED ORDER — ASPIRIN 81 MG PO TABS: 81.0000 mg | ORAL_TABLET | Freq: Every day | ORAL | 0 refills | Status: AC

## 2023-07-20 MED ORDER — CLOPIDOGREL BISULFATE 75 MG PO TABS: 75.0000 mg | ORAL_TABLET | Freq: Every day | ORAL | 3 refills | Status: AC

## 2023-07-20 NOTE — TOC Transition Note (Addendum)
 Transition of Care [facility]) - Discharge Note   Patient Details  Name: Marisa Marsh MRN: [id number] Date of Birth: Sep 22, 1945  [facility] CM/SW Contact:  Crayton Docker, RN 07/20/2023, 2:21 PM   Clinical Narrative:     Discharge orders noted. Patient to discharge to home/self care. Noted, OT recommendations, no OT follow up. CM call to patient, [phone], no answer, CM left message for return call.   Patient Goals and CMS Choice    Home/self care   Discharge Placement     Home/self care        Discharge Plan and Services Additional resources added to the After Visit Summary for      Home/self care              Social Drivers of Health (SDOH) Interventions SDOH Screenings   Food Insecurity: No Food Insecurity (07/19/2023)  Housing: Low Risk  (07/19/2023)  Transportation Needs: No Transportation Needs (07/19/2023)  Utilities: Not At Risk (07/19/2023)  Financial Resource Strain: Low Risk  (12/01/2022)   Received from [facility]  Physical Activity: Inactive (12/01/2022)   Received from [facility]  Social Connections: Moderately Isolated (07/19/2023)  Stress: Stress Concern Present (12/01/2022)   Received from [facility]  Tobacco Use: Medium Risk (07/19/2023)  Health Literacy: Low Risk  (12/01/2022)   Received from [facility]     Readmission Risk Interventions     No data to display

## 2023-07-20 NOTE — Hospital Course (Signed)
 Marisa Marsh is 78 y.o. female with type 2 diabetes, hypertension, hyperlipidemia, CKD stage IIIb, breast cancer status postlumpectomy and radiation, gout, OA, brought in for strokelike symptoms. When patient awoke on 4/16 she noted she had numbness of her lips and tingling sensation in the right arm and family noted she had slurred speech.  She reports she had significant difficulty walking and was unable to get up.  She reports that this episode gradually resolved without intervention.  She did not take her glucose during this time.  In the ED head CT was negative for acute findings, patient had stable vital signs, labs revealed worsening kidney function but otherwise unremarkable.  Patient was admitted for workup.  Brain MRI and MRA without contrast revealed no acute findings.  Echocardiogram with preserved LVEF, grade 1 diastolic dysfunction.  Lipid panel unremarkable.  HgbA1c found to be at goal 6.2%.   By reevaluation on 4/17 patient is completely back to her baseline with no residual deficits.  Physical therapy reports no need for skilled services.  Presentation most consistent with TIA.  Patient will need to be started on aspirin and Plavix for 21 days and then proceed with Plavix monotherapy per my discussion with neurology.  Patient was referred to neurology stroke clinic to follow-up in 2 weeks.  Patient was found to have elevated creatinine on arrival.  Most recent creatinine available for comparison from 3 years prior was 1.69, creatinine is elevated to 2.05, patient has been taking metformin and glimepiride outpatient.  She is unfortunately unable to afford Jardiance.  Her kidney function has resolved some but GFR is currently 29. Additionally patient reports that she is frequently having hypoglycemic episodes at home.  Given her current hemoglobin A1c of 6.2%, we have recommended she discontinue metformin and glimepiride until she can follow-up with her primary care doctor in the clinic next  week and discuss alternative agents.  Transient ischemic attack - MRI, MRA without acute findings - Echo LVEF 60 to 65%, grade 1 diastolic dysfunction - Lipid panel unremarkable, currently on statin - Hemoglobin A1c at goal 6.2% - Currently takes aspirin.  Will initiate Plavix.  Continue aspirin and Plavix x 21 days and then proceed with Lasix monotherapy. - Referral to neurology outpatient  CKD stage IIIb - Creatinine on arrival 2.0, resolved to 1.8 on day of discharge.  This appears closer to her baseline - Avoid nephrotoxic meds - Recommend discontinuation of metformin - Recommend holding lisinopril and HCTZ for now.  Close follow-up with PCP  Type 2 diabetes, well-controlled - Hemoglobin A1c 6.2% - Patient is on glimepiride and metformin.  PCP tried to transition to [place] but it was cost prohibitive - Patient is endorsing hypoglycemic episodes and has not been taking her glimepiride.  Will discontinue this medication.  Given worsening kidney function I have also discontinued metformin - Follow-up with PCP to discuss alternative options

## 2023-07-20 NOTE — Discharge Summary (Signed)
 Physician Discharge Summary   Patient: Marisa Marsh MRN: [id number] DOB: 1945/09/16  Admit date:     07/19/2023  Discharge date: 07/20/23  Discharge Physician: Debarah Crape   PCP: Cain Sieve, MD   Recommendations at discharge:   Follow-up with neurology in 2 weeks Follow-up with PCP for diabetes and blood pressure management.  Discharge Diagnoses: Principal Problem:   TIA (transient ischemic attack)  Resolved Problems:   * No resolved hospital problems. *  Hospital Course: Marisa Marsh is 78 y.o. female with type 2 diabetes, hypertension, hyperlipidemia, CKD stage IIIb, breast cancer status postlumpectomy and radiation, gout, OA, brought in for strokelike symptoms. When patient awoke on 4/16 she noted she had numbness of her lips and tingling sensation in the right arm and family noted she had slurred speech.  She reports she had significant difficulty walking and was unable to get up.  She reports that this episode gradually resolved without intervention.  She did not take her glucose during this time.  In the ED head CT was negative for acute findings, patient had stable vital signs, labs revealed worsening kidney function but otherwise unremarkable.  Patient was admitted for workup.  Brain MRI and MRA without contrast revealed no acute findings.  Echocardiogram with preserved LVEF, grade 1 diastolic dysfunction.  Lipid panel unremarkable.  HgbA1c found to be at goal 6.2%.   By reevaluation on 4/17 patient is completely back to her baseline with no residual deficits.  Physical therapy reports no need for skilled services.  Presentation most consistent with TIA.  Patient will need to be started on aspirin and Plavix for 21 days and then proceed with Plavix monotherapy per my discussion with neurology.  Patient was referred to neurology stroke clinic to follow-up in 2 weeks.  Patient was found to have elevated creatinine on arrival.  Most recent creatinine available for  comparison from 3 years prior was 1.69, creatinine is elevated to 2.05, patient has been taking metformin and glimepiride outpatient.  She is unfortunately unable to afford Jardiance.  Her kidney function has resolved some but GFR is currently 29. Additionally patient reports that she is frequently having hypoglycemic episodes at home.  Given her current hemoglobin A1c of 6.2%, we have recommended she discontinue metformin and glimepiride until she can follow-up with her primary care doctor in the clinic next week and discuss alternative agents.  Transient ischemic attack - MRI, MRA without acute findings - Echo LVEF 60 to 65%, grade 1 diastolic dysfunction - Lipid panel unremarkable, currently on statin - Hemoglobin A1c at goal 6.2% - Currently takes aspirin.  Will initiate Plavix.  Continue aspirin and Plavix x 21 days and then proceed with Lasix monotherapy. - Referral to neurology outpatient  CKD stage IIIb - Creatinine on arrival 2.0, resolved to 1.8 on day of discharge.  This appears closer to her baseline - Avoid nephrotoxic meds - Recommend discontinuation of metformin - Recommend holding lisinopril and HCTZ for now.  Close follow-up with PCP  Type 2 diabetes, well-controlled - Hemoglobin A1c 6.2% - Patient is on glimepiride and metformin.  PCP tried to transition to [place] but it was cost prohibitive - Patient is endorsing hypoglycemic episodes and has not been taking her glimepiride.  Will discontinue this medication.  Given worsening kidney function I have also discontinued metformin - Follow-up with PCP to discuss alternative options  Disposition: Home  Discharge Instructions     Ambulatory referral to Neurology   Complete by: As directed  An appointment is requested in approximately: 2 weeks. TIA follow up in stroke clinic   Call MD for:  difficulty breathing, headache or visual disturbances   Complete by: As directed    Call MD for:  persistant dizziness or  light-headedness   Complete by: As directed    Call MD for:  persistant nausea and vomiting   Complete by: As directed    Call MD for:  severe uncontrolled pain   Complete by: As directed    Call MD for:  temperature >100.4   Complete by: As directed    Diet general   Complete by: As directed    Discharge instructions   Complete by: As directed    You were admitted for a TIA, transient ischemic attack.  You are being started on a strong antiplatelet medication called Plavix (clopidogrel).  You will need to take Plavix and aspirin for 21 days and then stop taking aspirin and take Plavix only.  You have been referred to neurology and need to see them in the clinic in 2 weeks for further follow-up.    As we discussed your kidney function has decreased and can no longer tolerate metformin.  I am also concerned that your frequent low blood sugars are a result of glimepiride.  We recommend you discontinue both of these medications and follow-up with your primary care physician in 1 week to discuss alternative options.   Increase activity slowly   Complete by: As directed        Diet recommendation:  Discharge Diet Orders (From admission, onward)     Start     Ordered   07/20/23 0000  Diet general        07/20/23 1317           Cardiac and Carb modified diet DISCHARGE MEDICATION: Allergies as of 07/20/2023       Reactions   Simvastatin Other (See Comments)   MUSCLE PAIN        Medication List     PAUSE taking these medications    lisinopril-hydrochlorothiazide 20-12.5 MG tablet Wait to take this until your doctor or other care provider tells you to start again. Commonly known as: ZESTORETIC Take 2 tablets by mouth every morning.       STOP taking these medications    glimepiride 4 MG tablet Commonly known as: AMARYL   metFORMIN 1000 MG tablet Commonly known as: GLUCOPHAGE       TAKE these medications    allopurinol 300 MG tablet Commonly known as:  ZYLOPRIM Take 300 mg by mouth daily.   amLODipine 10 MG tablet Commonly known as: NORVASC Take 10 mg by mouth daily.   aspirin 81 MG tablet Take 1 tablet (81 mg total) by mouth at bedtime for 21 days.   clopidogrel 75 MG tablet Commonly known as: Plavix Take 1 tablet (75 mg total) by mouth daily.   diphenhydramine-acetaminophen 25-500 MG Tabs tablet Commonly known as: TYLENOL PM Take 2 tablets by mouth at bedtime as needed (for sleep).   HYDROcodone-acetaminophen 7.5-325 MG tablet Commonly known as: NORCO Take 1 tablet by mouth 2 (two) times daily as needed for moderate pain.   indomethacin 50 MG capsule Commonly known as: INDOCIN Take 50 mg by mouth daily as needed for moderate pain.   levETIRAcetam 500 MG tablet Commonly known as: KEPPRA Take 500 mg by mouth 3 (three) times daily.   LORazepam 0.5 MG tablet Commonly known as: ATIVAN Take 0.5 mg by mouth daily  as needed for anxiety.   nortriptyline 25 MG capsule Commonly known as: PAMELOR Take 50 mg by mouth at bedtime.   pravastatin 80 MG tablet Commonly known as: PRAVACHOL Take 80 mg by mouth at bedtime.   vitamin B-12 500 MCG tablet Commonly known as: CYANOCOBALAMIN Take 500 mcg by mouth daily.   Vitamin D3 25 MCG (1000 UT) Caps Take 1,000 Units by mouth 2 (two) times daily.        Discharge Exam: Filed Weights   07/19/23 0948  Weight: 61.2 kg   Constitutional:  Normal appearance. Non toxic-appearing.  HENT: Head Normocephalic and atraumatic.  Mucous membranes are moist.  Eyes:  Extraocular intact. Conjunctivae normal. Pupils are equal, round, and reactive to light.  Cardiovascular: Rate and Rhythm: Normal rate and regular rhythm.  Pulmonary: Non labored, symmetric rise of chest wall.  Musculoskeletal:  Normal range of motion.  Skin: warm and dry. not jaundiced.  Neurological: No focal deficit present. alert. Oriented. Psychiatric: Mood and Affect congruent.   Condition at discharge:  stable  The results of significant diagnostics from this hospitalization (including imaging, microbiology, ancillary and laboratory) are listed below for reference.   Imaging Studies: ECHOCARDIOGRAM COMPLETE Result Date: 07/19/2023    ECHOCARDIOGRAM REPORT   Patient Name:   FINLAY MILLS Sliwinski Date of Exam: [birth year] Medical Rec #:  [id number]      Height:       65.0 in Accession #:    [id number]     Weight:       135.0 lb Date of Birth:  Apr 01, 1946     BSA:          1.674 m Patient Age:    77 years       BP:           120/65 mmHg Patient Gender: F              HR:           63 bpm. Exam Location:  [facility] Procedure: 2D Echo, Cardiac Doppler, Color Doppler and Strain Analysis (Both            Spectral and Color Flow Doppler were utilized during procedure). Indications:     TIA  History:         Patient has no prior history of Echocardiogram examinations.                  TIA; Risk Factors:Hypertension, Diabetes and Dyslipidemia.                  Breast CA.  Sonographer:     Clarke Crouch Referring Phys:  [id number] [place] Diagnosing Phys: Belva Boyden MD  Sonographer Comments: Technically difficult study due to poor echo windows and suboptimal parasternal window. Image acquisition challenging due to respiratory motion. Global longitudinal strain was attempted. IMPRESSIONS  1. Left ventricular ejection fraction, by estimation, is 60 to 65%. The left ventricle has normal function. The left ventricle has no regional wall motion abnormalities. Left ventricular diastolic parameters are consistent with Grade I diastolic dysfunction (impaired relaxation). The average left ventricular global longitudinal strain is -12.4 %.  2. Right ventricular systolic function is normal. The right ventricular size is normal. There is normal pulmonary artery systolic pressure. The estimated right ventricular systolic pressure is 25.3 mmHg.  3. The mitral valve is normal in structure. Mild mitral valve regurgitation. No evidence of  mitral stenosis.  4. The aortic valve is normal in structure. Aortic valve regurgitation  is not visualized. Aortic valve sclerosis is present, with no evidence of aortic valve stenosis.  5. The inferior vena cava is normal in size with greater than 50% respiratory variability, suggesting right atrial pressure of 3 mmHg. FINDINGS  Left Ventricle: Left ventricular ejection fraction, by estimation, is 60 to 65%. The left ventricle has normal function. The left ventricle has no regional wall motion abnormalities. The average left ventricular global longitudinal strain is -12.4 %. Strain was performed and the global longitudinal strain is indeterminate. The left ventricular internal cavity size was normal in size. There is no left ventricular hypertrophy. Left ventricular diastolic parameters are consistent with Grade I diastolic dysfunction (impaired relaxation). Right Ventricle: The right ventricular size is normal. No increase in right ventricular wall thickness. Right ventricular systolic function is normal. There is normal pulmonary artery systolic pressure. The tricuspid regurgitant velocity is 2.36 m/s, and  with an assumed right atrial pressure of 3 mmHg, the estimated right ventricular systolic pressure is 25.3 mmHg. Left Atrium: Left atrial size was normal in size. Right Atrium: Right atrial size was normal in size. Pericardium: There is no evidence of pericardial effusion. Mitral Valve: The mitral valve is normal in structure. There is mild calcification of the mitral valve leaflet(s). Mild mitral valve regurgitation. No evidence of mitral valve stenosis. MV peak gradient, 6.6 mmHg. The mean mitral valve gradient is 2.0 mmHg. Tricuspid Valve: The tricuspid valve is normal in structure. Tricuspid valve regurgitation is mild . No evidence of tricuspid stenosis. Aortic Valve: The aortic valve is normal in structure. Aortic valve regurgitation is not visualized. Aortic valve sclerosis is present, with no evidence  of aortic valve stenosis. Aortic valve mean gradient measures 4.0 mmHg. Aortic valve peak gradient measures 7.5 mmHg. Aortic valve area, by VTI measures 2.65 cm. Pulmonic Valve: The pulmonic valve was normal in structure. Pulmonic valve regurgitation is not visualized. No evidence of pulmonic stenosis. Aorta: The aortic root is normal in size and structure. Venous: The inferior vena cava is normal in size with greater than 50% respiratory variability, suggesting right atrial pressure of 3 mmHg. IAS/Shunts: No atrial level shunt detected by color flow Doppler. Additional Comments: 3D was performed not requiring image post processing on an independent workstation and was indeterminate.  LEFT VENTRICLE PLAX 2D LVIDd:         4.00 cm     Diastology LVIDs:         2.90 cm     LV e' medial:    6.96 cm/s LV PW:         1.20 cm     LV E/e' medial:  12.4 LV IVS:        1.30 cm     LV e' lateral:   8.59 cm/s LVOT diam:     2.00 cm     LV E/e' lateral: 10.1 LV SV:         82 LV SV Index:   49          2D Longitudinal Strain LVOT Area:     3.14 cm    2D Strain GLS Avg:     -12.4 %  LV Volumes (MOD) LV vol d, MOD A2C: 47.2 ml LV vol d, MOD A4C: 46.7 ml LV vol s, MOD A2C: 15.7 ml LV vol s, MOD A4C: 21.0 ml LV SV MOD A2C:     31.5 ml LV SV MOD A4C:     46.7 ml LV SV MOD BP:  28.8 ml RIGHT VENTRICLE RV Basal diam:  3.65 cm RV Mid diam:    3.10 cm RV S prime:     15.30 cm/s TAPSE (M-mode): 2.4 cm LEFT ATRIUM             Index        RIGHT ATRIUM           Index LA diam:        3.80 cm 2.27 cm/m   RA Area:     16.10 cm LA Vol (A2C):   41.2 ml 24.61 ml/m  RA Volume:   43.70 ml  26.11 ml/m LA Vol (A4C):   32.1 ml 19.18 ml/m LA Biplane Vol: 37.1 ml 22.16 ml/m  AORTIC VALVE                    PULMONIC VALVE AV Area (Vmax):    2.34 cm     PV Vmax:       1.07 m/s AV Area (Vmean):   2.31 cm     PV Peak grad:  4.6 mmHg AV Area (VTI):     2.65 cm AV Vmax:           137.00 cm/s AV Vmean:          87.400 cm/s AV VTI:             0.308 m AV Peak Grad:      7.5 mmHg AV Mean Grad:      4.0 mmHg LVOT Vmax:         102.00 cm/s LVOT Vmean:        64.400 cm/s LVOT VTI:          0.260 m LVOT/AV VTI ratio: 0.84  AORTA Ao Root diam: 2.80 cm MITRAL VALVE                TRICUSPID VALVE MV Area (PHT): 3.23 cm     TR Peak grad:   22.3 mmHg MV Area VTI:   2.34 cm     TR Vmax:        236.00 cm/s MV Peak grad:  6.6 mmHg MV Mean grad:  2.0 mmHg     SHUNTS MV Vmax:       1.28 m/s     Systemic VTI:  0.26 m MV Vmean:      67.8 cm/s    Systemic Diam: 2.00 cm MV Decel Time: 235 msec MV E velocity: 86.50 cm/s MV A velocity: 125.00 cm/s MV E/A ratio:  0.69 Belva Boyden MD Electronically signed by Belva Boyden MD Signature Date/Time: 07/19/2023/4:02:26 PM    Final    MR BRAIN WO CONTRAST Result Date: 07/19/2023 CLINICAL DATA:  Transient ischemic attack.  Weakness. EXAM: MRI HEAD WITHOUT CONTRAST MRA HEAD WITHOUT CONTRAST MRA NECK WITHOUT CONTRAST TECHNIQUE: Multiplanar, multiecho pulse sequences of the brain and surrounding structures were obtained without intravenous contrast. Angiographic images of the Circle of Willis were obtained using MRA technique without intravenous contrast. Angiographic images of the neck were obtained using MRA technique without intravenous contrast. Carotid stenosis measurements (when applicable) are obtained utilizing NASCET criteria, using the distal internal carotid diameter as the denominator. COMPARISON:  Head CT same day FINDINGS: MRI HEAD FINDINGS Brain: Diffusion imaging does not show any acute or subacute infarction or other cause of restricted diffusion. No abnormality affects the brainstem or cerebellum. Cerebral hemispheres show minimal small vessel change of the white matter for a person of this age. No cortical or  large vessel territory infarction. No mass lesion, hemorrhage, hydrocephalus or extra-axial collection. Vascular: Major vessels at the base of the brain show flow. Skull and upper cervical spine: Negative  Sinuses/Orbits: Clear/normal Other: None MRA HEAD FINDINGS Both internal carotid arteries are widely patent into the brain. No siphon stenosis. The anterior and middle cerebral vessels are patent without proximal stenosis, aneurysm or vascular malformation. Both vertebral arteries are widely patent to the basilar. No basilar stenosis. Posterior circulation branch vessels appear normal. MRA NECK FINDINGS Branching pattern from the arch is normal. Detail of the proximal brachiocephalic vessels is poor using noncontrast technique. No significant finding is suspected. Both common carotid arteries are widely patent to the bifurcation. Both carotid bifurcations appear normal. No stenosis or irregularity. Cervical internal carotid arteries are normal. Vertebral artery origins are poorly seen as noted above. Beyond the origins, the vessels are widely patent through the cervical region to the foramen magnum. The left is dominant. IMPRESSION: 1. No acute brain MRI finding. Minimal small vessel change of the cerebral hemispheric white matter, less than often seen in a person of this age. 2. Normal intracranial MR angiography of the large and medium size vessels. 3. Normal MR angiography of the neck vessels. Proximal brachiocephalic vessels are poorly seen due to noncontrast technique. Electronically Signed   By: Bettylou Brunner M.D.   On: 07/19/2023 14:35   MR ANGIO HEAD WO CONTRAST Result Date: 07/19/2023 CLINICAL DATA:  Transient ischemic attack.  Weakness. EXAM: MRI HEAD WITHOUT CONTRAST MRA HEAD WITHOUT CONTRAST MRA NECK WITHOUT CONTRAST TECHNIQUE: Multiplanar, multiecho pulse sequences of the brain and surrounding structures were obtained without intravenous contrast. Angiographic images of the Circle of Willis were obtained using MRA technique without intravenous contrast. Angiographic images of the neck were obtained using MRA technique without intravenous contrast. Carotid stenosis measurements (when applicable) are  obtained utilizing NASCET criteria, using the distal internal carotid diameter as the denominator. COMPARISON:  Head CT same day FINDINGS: MRI HEAD FINDINGS Brain: Diffusion imaging does not show any acute or subacute infarction or other cause of restricted diffusion. No abnormality affects the brainstem or cerebellum. Cerebral hemispheres show minimal small vessel change of the white matter for a person of this age. No cortical or large vessel territory infarction. No mass lesion, hemorrhage, hydrocephalus or extra-axial collection. Vascular: Major vessels at the base of the brain show flow. Skull and upper cervical spine: Negative Sinuses/Orbits: Clear/normal Other: None MRA HEAD FINDINGS Both internal carotid arteries are widely patent into the brain. No siphon stenosis. The anterior and middle cerebral vessels are patent without proximal stenosis, aneurysm or vascular malformation. Both vertebral arteries are widely patent to the basilar. No basilar stenosis. Posterior circulation branch vessels appear normal. MRA NECK FINDINGS Branching pattern from the arch is normal. Detail of the proximal brachiocephalic vessels is poor using noncontrast technique. No significant finding is suspected. Both common carotid arteries are widely patent to the bifurcation. Both carotid bifurcations appear normal. No stenosis or irregularity. Cervical internal carotid arteries are normal. Vertebral artery origins are poorly seen as noted above. Beyond the origins, the vessels are widely patent through the cervical region to the foramen magnum. The left is dominant. IMPRESSION: 1. No acute brain MRI finding. Minimal small vessel change of the cerebral hemispheric white matter, less than often seen in a person of this age. 2. Normal intracranial MR angiography of the large and medium size vessels. 3. Normal MR angiography of the neck vessels. Proximal brachiocephalic vessels are poorly seen due to  noncontrast technique.  Electronically Signed   By: Bettylou Brunner M.D.   On: 07/19/2023 14:35   MR ANGIO NECK WO CONTRAST Result Date: 07/19/2023 CLINICAL DATA:  Transient ischemic attack.  Weakness. EXAM: MRI HEAD WITHOUT CONTRAST MRA HEAD WITHOUT CONTRAST MRA NECK WITHOUT CONTRAST TECHNIQUE: Multiplanar, multiecho pulse sequences of the brain and surrounding structures were obtained without intravenous contrast. Angiographic images of the Circle of Willis were obtained using MRA technique without intravenous contrast. Angiographic images of the neck were obtained using MRA technique without intravenous contrast. Carotid stenosis measurements (when applicable) are obtained utilizing NASCET criteria, using the distal internal carotid diameter as the denominator. COMPARISON:  Head CT same day FINDINGS: MRI HEAD FINDINGS Brain: Diffusion imaging does not show any acute or subacute infarction or other cause of restricted diffusion. No abnormality affects the brainstem or cerebellum. Cerebral hemispheres show minimal small vessel change of the white matter for a person of this age. No cortical or large vessel territory infarction. No mass lesion, hemorrhage, hydrocephalus or extra-axial collection. Vascular: Major vessels at the base of the brain show flow. Skull and upper cervical spine: Negative Sinuses/Orbits: Clear/normal Other: None MRA HEAD FINDINGS Both internal carotid arteries are widely patent into the brain. No siphon stenosis. The anterior and middle cerebral vessels are patent without proximal stenosis, aneurysm or vascular malformation. Both vertebral arteries are widely patent to the basilar. No basilar stenosis. Posterior circulation branch vessels appear normal. MRA NECK FINDINGS Branching pattern from the arch is normal. Detail of the proximal brachiocephalic vessels is poor using noncontrast technique. No significant finding is suspected. Both common carotid arteries are widely patent to the bifurcation. Both carotid  bifurcations appear normal. No stenosis or irregularity. Cervical internal carotid arteries are normal. Vertebral artery origins are poorly seen as noted above. Beyond the origins, the vessels are widely patent through the cervical region to the foramen magnum. The left is dominant. IMPRESSION: 1. No acute brain MRI finding. Minimal small vessel change of the cerebral hemispheric white matter, less than often seen in a person of this age. 2. Normal intracranial MR angiography of the large and medium size vessels. 3. Normal MR angiography of the neck vessels. Proximal brachiocephalic vessels are poorly seen due to noncontrast technique. Electronically Signed   By: Bettylou Brunner M.D.   On: 07/19/2023 14:35   CT Head Wo Contrast Result Date: 07/19/2023 CLINICAL DATA:  Slurred speech and weakness upon wakening today, now resolved. EXAM: CT HEAD WITHOUT CONTRAST TECHNIQUE: Contiguous axial images were obtained from the base of the skull through the vertex without intravenous contrast. RADIATION DOSE REDUCTION: This exam was performed according to the departmental dose-optimization program which includes automated exposure control, adjustment of the mA and/or kV according to patient size and/or use of iterative reconstruction technique. COMPARISON:  None Available. FINDINGS: Brain: Mild age related volume loss. No evidence of old or acute focal infarction, mass lesion, hemorrhage, hydrocephalus or extra-axial collection. Vascular: There is atherosclerotic calcification of the major vessels at the base of the brain. Skull: Negative Sinuses/Orbits: Clear/normal Other: None IMPRESSION: No acute CT finding. Mild age related volume loss. Atherosclerotic calcification of the major vessels at the base of the brain. Electronically Signed   By: Bettylou Brunner M.D.   On: 07/19/2023 10:44    Microbiology: No results found for this or any previous visit.  Labs: CBC: Recent Labs  Lab 07/19/23 1013 07/20/23 1104  WBC 10.0  7.9  HGB 10.8* 10.9*  HCT 33.7* 32.5*  MCV 94.4  89.5  PLT 270 308   Basic Metabolic Panel: Recent Labs  Lab 07/19/23 1013 07/20/23 1104  NA 139 137  K 4.1 4.3  CL 109 103  CO2 19* 24  GLUCOSE 93 266*  BUN 36* 29*  CREATININE 2.05* 1.80*  CALCIUM 9.9 9.9   Liver Function Tests: Recent Labs  Lab 07/20/23 1104  AST 18  ALT 11  ALKPHOS 22*  BILITOT 0.7  PROT 7.4  ALBUMIN 3.9   CBG: Recent Labs  Lab 07/19/23 1606 07/19/23 2059 07/19/23 2357 07/20/23 0817 07/20/23 1203  GLUCAP 103* 221* 109* 86 212*    Discharge time spent: 31 minutes.  Signed: Miri Jose, DO [facility] 07/20/2023

## 2023-07-20 NOTE — Care Management Obs Status (Signed)
 MEDICARE OBSERVATION STATUS NOTIFICATION   Patient Details  Name: Marisa Marsh MRN: [id number] Date of Birth: [birth year]   Medicare Observation Status Notification Given:  Rudolph Cost, CMA 07/20/2023, 9:51 AM

## 2023-07-20 NOTE — Progress Notes (Signed)
 SLP Cancellation Note  Patient Details Name: Marisa Marsh MRN: [id number] DOB: 1945-09-02   Cancelled treatment:       Reason Eval/Treat Not Completed: SLP screened, no needs identified, will sign off. Chart review completed- CT Head/MRI negative. speech language eval order received. Pt denied cognitive communication deficits, reporting that she is at her baseline. No SLP services indicated at this time.   [place] Amatullah Christy Clapp, MS, CCC-SLP Speech Language Pathologist Rehab Services; [facility] [phone] (ascom)     [place] J Clapp 07/20/2023, 11:49 AM

## 2023-07-20 NOTE — Progress Notes (Signed)
 PT Cancellation Note  Patient Details Name: Marisa Marsh MRN: [id number] DOB: 1945/10/09   Cancelled Treatment:    Reason Eval/Treat Not Completed: PT screened, no needs identified, will sign off. Patient IND for mobility, at functional baseline. No skilled PT needs at this time. PT will sign off.    Jaedin Regina M Fairly, PT, DPT 07/20/23 9:37 AM

## 2023-07-20 NOTE — Evaluation (Signed)
 Occupational Therapy Evaluation Patient Details Name: Marisa Marsh MRN: [id number] DOB: 01-30-1946 Today's Date: 07/20/2023   History of Present Illness   Kirstein G Folmar is a 78 y.o. female with medical history significant of IIDM, HTN, HLD, CKD stage III, breast cancer status post lumpectomy and radiation therapy, gout, OA, brought in by family member for stroke-like symptoms. CTA and MRI negative.     Clinical Impressions Pt was seen for OT evaluation this date. Prior to hospital admission, pt was living at home with her husband with 5 STE and HR. IND with ADLs, IADLs, driving and community mobile. No AD use for mobility. Built in shower seat utilized for bathing. 1 fall trying to get dressed standing up watching tv 9 months ago. Pt is seated EOB on entry and reports feeling back to her baseline. She ambulated independently to the bathroom with nursing and 1 lap around nursing station with author without LOB and no AD use. Reports she ambulated at her normal gait speed. BUE/BLE strength, ROM and sensation all intact. She has no acute OT needs and will sign off in house with no follow up services needed.       If plan is discharge home, recommend the following:         Functional Status Assessment   Patient has not had a recent decline in their functional status     Equipment Recommendations   None recommended by OT     Recommendations for Other Services         Precautions/Restrictions   Precautions Precautions: None Restrictions Weight Bearing Restrictions Per Provider Order: No     Mobility Bed Mobility               General bed mobility comments: seated EOB on entry    Transfers Overall transfer level: Independent                 General transfer comment: IND for STS and moblity around nursing station without LOB, steady gait; pt feels at baseline      Balance Overall balance assessment: Mild deficits observed, not formally tested                                          ADL either performed or assessed with clinical judgement   ADL Overall ADL's : Independent                                       General ADL Comments: husband assists with socks/shoes, otherwise pt is IND     Vision         Perception         Praxis         Pertinent Vitals/Pain Pain Assessment Pain Assessment: No/denies pain     Extremity/Trunk Assessment Upper Extremity Assessment Upper Extremity Assessment: Overall WFL for tasks assessed;Right hand dominant   Lower Extremity Assessment Lower Extremity Assessment: Overall WFL for tasks assessed       Communication Communication Communication: No apparent difficulties   Cognition Arousal: Alert Behavior During Therapy: WFL for tasks assessed/performed Cognition: No apparent impairments                               Following commands:  Intact       Cueing  General Comments          Exercises     Shoulder Instructions      Home Living Family/patient expects to be discharged to:: Private residence Living Arrangements: Spouse/significant other Available Help at Discharge: Family;Available 24 hours/day (daughter lives nearby and brings meals n) Type of Home: House Home Access: Stairs to enter Secretary/administrator of Steps: 5 Entrance Stairs-Rails: Right Home Layout: One level     Bathroom Shower/Tub: Producer, television/film/video: Standard     Home Equipment: Information systems manager - built in          Prior Functioning/Environment Prior Level of Function : Independent/Modified Independent;Driving             Mobility Comments: no AD use, IND ADLs Comments: IND with ADLs, IADLs, driving herself to MD appts; husband and her recently went to [place]     OT Problem List:     OT Treatment/Interventions:        OT Goals(Current goals can be found in the care plan section)       OT Frequency:        Co-evaluation              AM-PAC OT "6 Clicks" Daily Activity     Outcome Measure Help from another person eating meals?: None Help from another person taking care of personal grooming?: None Help from another person toileting, which includes using toliet, bedpan, or urinal?: None Help from another person bathing (including washing, rinsing, drying)?: None Help from another person to put on and taking off regular upper body clothing?: None Help from another person to put on and taking off regular lower body clothing?: None 6 Click Score: 24   End of Session Nurse Communication: Mobility status  Activity Tolerance: Patient tolerated treatment well Patient left: in bed;with call bell/phone within reach  OT Visit Diagnosis: Other abnormalities of gait and mobility (R26.89)                Time: 3244-0102 OT Time Calculation (min): 19 min Charges:  OT General Charges $OT Visit: 1 Visit OT Evaluation $OT Eval Low Complexity: 1 Low Madden Garron, OTR/L 07/20/23, 9:18 AM  Amarri Satterly E Amaris Delafuente 07/20/2023, 9:15 AM

## 2023-08-04 ENCOUNTER — Other Ambulatory Visit: Payer: Self-pay

## 2023-08-04 DIAGNOSIS — Z853 Personal history of malignant neoplasm of breast: Secondary | ICD-10-CM

## 2023-08-04 DIAGNOSIS — N6322 Unspecified lump in the left breast, upper inner quadrant: Secondary | ICD-10-CM

## 2023-08-08 ENCOUNTER — Ambulatory Visit
Admission: RE | Admit: 2023-08-08 | Discharge: 2023-08-08 | Disposition: A | Discharge status: Home / Self Care | Source: Ambulatory Visit

## 2023-08-08 DIAGNOSIS — N6322 Unspecified lump in the left breast, upper inner quadrant: Secondary | ICD-10-CM | POA: Insufficient documentation

## 2023-08-08 DIAGNOSIS — Z853 Personal history of malignant neoplasm of breast: Secondary | ICD-10-CM | POA: Insufficient documentation

## 2024-01-15 ENCOUNTER — Other Ambulatory Visit: Payer: Self-pay | Admitting: Internal Medicine

## 2024-01-15 DIAGNOSIS — Z1231 Encounter for screening mammogram for malignant neoplasm of breast: Secondary | ICD-10-CM

## 2024-02-22 ENCOUNTER — Encounter

## 2024-03-15 ENCOUNTER — Other Ambulatory Visit: Payer: Self-pay

## 2024-04-05 ENCOUNTER — Ambulatory Visit
Admission: RE | Admit: 2024-04-05 | Discharge: 2024-04-05 | Disposition: A | Discharge status: Home / Self Care | Source: Ambulatory Visit | Attending: Internal Medicine | Admitting: Internal Medicine

## 2024-04-05 DIAGNOSIS — Z1231 Encounter for screening mammogram for malignant neoplasm of breast: Secondary | ICD-10-CM | POA: Insufficient documentation
